# Patient Record
Sex: Female | Born: 1965 | Race: White | Hispanic: No | Marital: Married | Smoking: Never smoker
Health system: Southern US, Community
[De-identification: ages and names within clinical notes are randomized; demographics above are authoritative.]

## PROBLEM LIST (undated history)

## (undated) DIAGNOSIS — E785 Hyperlipidemia, unspecified: Secondary | ICD-10-CM

## (undated) DIAGNOSIS — I10 Essential (primary) hypertension: Secondary | ICD-10-CM

## (undated) HISTORY — DX: Essential (primary) hypertension: I10

## (undated) HISTORY — PX: CHOLECYSTECTOMY: SHX55

## (undated) HISTORY — DX: Hyperlipidemia, unspecified: E78.5

## (undated) HISTORY — PX: SPINAL FUSION: SHX223

## (undated) HISTORY — PX: HYSTERECTOMY ABDOMINAL WITH SALPINGECTOMY: SHX6725

---

## 1997-12-13 ENCOUNTER — Other Ambulatory Visit: Admission: RE | Admit: 1997-12-13 | Discharge: 1997-12-13 | Payer: Self-pay | Admitting: Obstetrics & Gynecology

## 1998-01-17 ENCOUNTER — Other Ambulatory Visit: Admission: RE | Admit: 1998-01-17 | Discharge: 1998-01-17 | Payer: Self-pay | Admitting: Obstetrics & Gynecology

## 1998-04-04 ENCOUNTER — Other Ambulatory Visit: Admission: RE | Admit: 1998-04-04 | Discharge: 1998-04-04 | Payer: Self-pay | Admitting: Obstetrics & Gynecology

## 1998-06-26 ENCOUNTER — Other Ambulatory Visit: Admission: RE | Admit: 1998-06-26 | Discharge: 1998-06-26 | Payer: Self-pay | Admitting: Obstetrics & Gynecology

## 1998-07-28 ENCOUNTER — Other Ambulatory Visit: Admission: RE | Admit: 1998-07-28 | Discharge: 1998-07-28 | Payer: Self-pay | Admitting: Obstetrics & Gynecology

## 1999-03-14 ENCOUNTER — Other Ambulatory Visit: Admission: RE | Admit: 1999-03-14 | Discharge: 1999-03-14 | Payer: Self-pay | Admitting: Obstetrics & Gynecology

## 1999-10-02 ENCOUNTER — Other Ambulatory Visit: Admission: RE | Admit: 1999-10-02 | Discharge: 1999-10-02 | Payer: Self-pay | Admitting: Obstetrics & Gynecology

## 1999-12-14 ENCOUNTER — Ambulatory Visit (HOSPITAL_COMMUNITY): Admission: RE | Admit: 1999-12-14 | Discharge: 1999-12-14 | Payer: Self-pay | Admitting: Obstetrics & Gynecology

## 2000-03-04 ENCOUNTER — Other Ambulatory Visit: Admission: RE | Admit: 2000-03-04 | Discharge: 2000-03-04 | Payer: Self-pay | Admitting: Obstetrics & Gynecology

## 2000-05-30 ENCOUNTER — Encounter (INDEPENDENT_AMBULATORY_CARE_PROVIDER_SITE_OTHER): Payer: Self-pay

## 2000-05-30 ENCOUNTER — Observation Stay (HOSPITAL_COMMUNITY): Admission: RE | Admit: 2000-05-30 | Discharge: 2000-05-31 | Payer: Self-pay | Admitting: Obstetrics & Gynecology

## 2000-09-15 ENCOUNTER — Other Ambulatory Visit: Admission: RE | Admit: 2000-09-15 | Discharge: 2000-09-15 | Payer: Self-pay | Admitting: Obstetrics & Gynecology

## 2001-10-29 ENCOUNTER — Other Ambulatory Visit: Admission: RE | Admit: 2001-10-29 | Discharge: 2001-10-29 | Payer: Self-pay | Admitting: Obstetrics & Gynecology

## 2002-02-17 ENCOUNTER — Other Ambulatory Visit: Admission: RE | Admit: 2002-02-17 | Discharge: 2002-02-17 | Payer: Self-pay | Admitting: Obstetrics and Gynecology

## 2002-02-17 ENCOUNTER — Other Ambulatory Visit: Admission: RE | Admit: 2002-02-17 | Discharge: 2002-02-17 | Payer: Self-pay | Admitting: Family Medicine

## 2003-01-05 ENCOUNTER — Other Ambulatory Visit: Admission: RE | Admit: 2003-01-05 | Discharge: 2003-01-05 | Payer: Self-pay | Admitting: Obstetrics & Gynecology

## 2004-08-31 ENCOUNTER — Other Ambulatory Visit: Admission: RE | Admit: 2004-08-31 | Discharge: 2004-08-31 | Payer: Self-pay | Admitting: Obstetrics & Gynecology

## 2009-01-03 ENCOUNTER — Ambulatory Visit (HOSPITAL_COMMUNITY): Admission: RE | Admit: 2009-01-03 | Discharge: 2009-01-03 | Payer: Self-pay | Admitting: Obstetrics & Gynecology

## 2009-01-03 ENCOUNTER — Encounter (INDEPENDENT_AMBULATORY_CARE_PROVIDER_SITE_OTHER): Payer: Self-pay | Admitting: Obstetrics & Gynecology

## 2010-09-30 LAB — CBC
HCT: 41 % (ref 36.0–46.0)
Hemoglobin: 14.2 g/dL (ref 12.0–15.0)
MCHC: 34.7 g/dL (ref 30.0–36.0)
MCV: 91.3 fL (ref 78.0–100.0)
Platelets: 227 10*3/uL (ref 150–400)
RDW: 13.5 % (ref 11.5–15.5)

## 2010-09-30 LAB — APTT: aPTT: 35 seconds (ref 24–37)

## 2010-11-06 NOTE — Op Note (Signed)
[name], Shirley Barber                 ACCOUNT NO.:  [id number]   MEDICAL RECORD NO.:  [id number]          PATIENT TYPE:  AMB   LOCATION:  SDC                           FACILITY:  [facility]   PHYSICIAN:  Freddy Finner, M.D.   DATE OF BIRTH:  11-26-1965   DATE OF PROCEDURE:  01/03/2009  DATE OF DISCHARGE:                               OPERATIVE REPORT   PREOPERATIVE DIAGNOSES:  1. Pelvic pain.  2. Family history of breast cancer.  3. 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  4. 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   A second procedure is performed by Dr. Henderson Cloud and will be dictated  separately which is a trans-obturator tape, elevation of the  urethrovesical angle.   ANESTHESIA:  General endotracheal anesthesia.   ESTIMATED INTRAOPERATIVE BLOOD LOSS:  During the laparoscopy was 10 cc.   INTRAOPERATIVE COMPLICATIONS:  During the laparoscopy were none.   LAPAROSCOPIC PROCEDURE:  Bilateral salpingo-oophorectomy.   The patient is a 45 year old admitted on the morning of surgery who was  given a gram of Ancef preoperatively.  She was brought to the operating  room and now placed under adequate general endotracheal anesthesia,  placed in the dorsal lithotomy position using the [place] stirrup system.  Betadine prep of abdomen, perineum, mons and vagina was carried out in a  fashion similar to a laparoscopic-assisted vaginal hysterectomy.  The  bladder was evacuated with a Robinson catheter.  Sterile drapes were  applied.  Two small incisions were made, one at the umbilicus and just  above the symphysis and an 11-bladed disposable trocar was introduced  out the umbilicus while elevating the anterior abdominal wall manually.  Direct inspection revealed adequate placement with no evidence of injury  on entry.  Pneumoperitoneum was allowed to accumulate.  A 5-mm trocar  was placed through a small incision just above the  symphysis in the  midline.  This was done under direct visualization, again with no  evidence of injury on entry.  Through it, __________grasping forceps and  later the Nezhat irrigation system was used during the procedure.   Inspection of abdomen and pelvis was carried out.  No identifiable  abnormalities were made. There appeared to be no upper abdominal  adhesions.  The appendix was not visualized.  The tubes and ovaries were  normal.  The uterus was surgically absent.  Peritoneal surfaces of the  pelvis were normal.   The EnSeal device was then used to progressive seal and divide the right  infundibulopelvic ligament, round ligament and the remaining fragment of  upper broad ligament.  The left side was then treated essentially  identically.  The ovaries were morcellated and each was delivered in  segments through the operating channel of the laparoscope.  Irrigation  was carried out using the Nezhat system and lactated Ringer's solution.  The pelvis was completely dry under irrigation and under reduced intra-  abdominal pressure.  Photographs were made before and after removal of  the ovaries.  This portion of  the procedure was terminated.  Gas was  allowed to escape from the abdomen.  Incisions  were anesthetized with 0.25% plain Marcaine and closed with interrupted  subcuticular sutures of 3-0 Dexon.  Steri-Strips were applied to the  lower incision, sterile dressing to the umbilical incision.   At this point, Dr. Henderson Cloud proceeded with his procedure and that note  will be dictated separately.      Freddy Finner, M.D.  Electronically Signed     WRN/MEDQ  D:  01/03/2009  T:  01/03/2009  Job:  [id number]

## 2010-11-06 NOTE — Op Note (Signed)
Shirley Barber, Shirley Barber                 ACCOUNT NO.:  [id number]   MEDICAL RECORD NO.:  [id number]          PATIENT TYPE:  AMB   LOCATION:  SDC                           FACILITY:  [facility]   PHYSICIAN:  Guy Sandifer. Henderson Cloud, M.D. DATE OF BIRTH:  11-Jun-1966   DATE OF PROCEDURE:  01/03/2009  DATE OF DISCHARGE:                               OPERATIVE REPORT   PREOPERATIVE DIAGNOSIS:  Genuine stress urinary incontinence.   POSTOPERATIVE DIAGNOSIS:  Genuine stress urinary incontinence.   PROCEDURE:  Transobturator mid urethral sling and cystoscopy.   SURGEON:  Guy Sandifer. Henderson Cloud, MD   ANESTHESIA:  General with endotracheal intubation.   ESTIMATED BLOOD LOSS:  50 mL.   INDICATIONS AND CONSENT:  This patient is a 45 year old white female  with stress urinary continence on urodynamics and symptomatically.  Transobturator mid urethral sling has been discussed preoperatively.  Potential risks and complications have been discussed preoperatively  including but not limited to infection, organ damage, bleeding requiring  transfusion of blood products with HIV and hepatitis acquisition, DVT,  PE, pneumonia.  Success and failure rate of the slings have been  reviewed.  Delayed healing, erosion, dyspareunia, possible need for  prolonged self-catheterization, return to the operating room,  postoperative irritative voiding symptoms have been reviewed  preoperatively.  All questions were answered and consent is signed on  the chart.   PROCEDURE:  The patient was taken to the operating room where she  underwent laparoscopic BSO with Dr. Jennette Kettle which is dictated under a  separate note.  At the conclusion of that case, all counts were correct.  The patient was under general anesthesia via endotracheal intubation in  the dorsal lithotomy position.  Weighted speculum was placed.  The  points for the transobturator incisions were marked with a pen  bilaterally.  Both areas were injected with 0.5% lidocaine with  1:200,000 epinephrine.  The Foley catheter was placed and left in place.  The infraurethral vaginal mucosa was also injected in a similar fashion.  Stab incisions were made over the transobturator and marks bilaterally,  and a small midline incision was made below the urethra.  The halo  needles were then passed through the obturator foramen bilaterally with  the passage of the needle tip guided by the examining finger.  Careful  inspection reveals no evidence of perforation of the vaginal mucosa.  The patient was given indigo carmine.  Foley catheter was removed.  Cystoscopy was then carried out with a 70-degree cystoscope.  A good  puff of urine was noted from the ureters bilaterally.  There was no  evidence of bladder perforation or foreign body with a 360-degree  inspection.  The cystoscope was then removed.  Foley catheter was  replaced and left in place.  Polypropylene mesh sling was then placed on  the needle tips bilaterally and withdrawn back to the incisions  bilaterally.  Careful attention was made to assure that the sling was  resting flat with no kinks or rolls.  The sheath was then removed  intact.  Attention was adjusted so that the [facility] clamp placed below  the  sling can be rotated 90 degrees to the floor with zero tension.  Excess sling was trimmed at the level of the skin, and both skin  incisions were closed with Dermabond.  Vaginal incision was closed with  a running locking 2-0 Monocryl suture.  All counts were correct.  The  patient was awakened, taken to recovery room in stable condition.      Guy Sandifer Henderson Cloud, M.D.  Electronically Signed     JET/MEDQ  D:  01/03/2009  T:  01/04/2009  Job:  [id number]

## 2010-11-09 NOTE — Op Note (Signed)
[facility]  Patient:    Shirley Barber, Shirley Barber                          MRN: [id number] Proc. Date: 05/30/00 Attending:  Freddy Finner, M.D.                           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  OPERATIVE PROCEDURE:          Total vaginal hysterectomy.  SURGEON:                      Freddy Finner, M.D.  ASSISTANT:                    Luvenia Redden, M.D.  ESTIMATED BLOOD LOSS:         Less than 100 cc.  INTRAOPERATIVE COMPLICATIONS: None.  ANESTHESIA:                   General endotracheal.  PRESENT HISTORY:              Noted in the admission history and physical. She is admitted now for vaginal surgery.  DESCRIPTION OF PROCEDURE:  She was admitted on the morning of surgery, brought to the operating room.  There placed under adequate general endotracheal anesthesia.  She did receive one gram of Cefotan before arriving in the OR and was placed in PAF hose.  The perineum, lower abdomen and vagina were prepped in the usual fashion.  Sterile drapes were applied.  Posterior bladder and vaginal retractor was placed.  The cervix was grasped with a Gerilyn Pilgrim tenaculum.  Posterior colpotomy incision was made with curved Mayo scissors.  The cervix was circumscribed with a scalpel to release the mucosa.  Curved Heaney clamps were used to crossclamp the uterosacral pedicles.  These were divided sharply and ligated with 0 Monocryl in the Heaney fashion.  Bladder pillows were taken separately, divided sharply and ligated with 0 Monocryl.  The bladder was further advanced off of the cervix.  Total ligament pedicles were taken with curved Heaney clamps, divided sharply and then ligated with 0 Monocryl.  The perineum was entered anteriorly, thus the pedicles  were then taken with curved Heaney clamps, divided sharply and ligated with 0 Monocryl.  The uterus was delivered through the vaginal introitus.  Utero-ovarian pedicles were crossclamped with curved Heaney clamps, divided sharply and doubly ligated with free ties of 0 monocryl, followed by suture ligatures of 0 monocryl.  Small bleeding sources proximal to this pedicle on the left were fulgurated with the Bovie.  Hemostasis appeared to be adequate.  Tubes and ovaries were inspected and found to be normal.  These pedicles were then released.  The angles of the vagina were anchored to the uterosacrals with a mattress suture of 0 Monocryl.  The uterosacrals were plicated and posterior perineum closed with a single run of interrupted 0 Vicryl suture.  The vaginal cuff was closed vertically with figure-of-eight 0 Monocryl.  Hemostasis was complete. Foley catheter was placed.  The patient was awakened and taken to the recovery room in good condition.      DESCRIPTION OF PROCEDURE: DD:  05/30/00 TD:  05/30/00 Job: [id number]

## 2010-11-09 NOTE — H&P (Signed)
[facility]  Patient:    Shirley Barber, Shirley Barber                          MRN: [id number] Adm. Date:  05/30/00 Attending:  Freddy Finner, M.D.                         History and Physical  ADMITTING DIAGNOSES:          1. Uterine enlargement consistent with                                  adenomyosis.                               2. Pelvic endometriosis.                               3. Persistent pelvic pain.                               4. Dyspareunia, unresponsive to more                                  conservative surgery and to continuous                                  oral contraceptive treatment.  HISTORY OF PRESENT ILLNESS:   Patient is a 45 year old white married female, gravida 3, para 2, who has long-standing history of benign pelvic endometriosis, with the first diagnosis being made in 1989 at the time of laparoscopic sterilization.  She had conservative surgery laparoscopically for endometriosis in 1994 and again in June of 2001.  Her current endometriosis was noted at that time and surgically removed by fulguration of the lesions. She also had uterosacral nerve ablation and was found to have enlargement of the uterus.  Following surgery she was started on Loestrin 1.5 30 continuously for six weeks and still did not have complete resolution of her symptoms.  She has requested definitive surgical intervention and is admitted at this time for vaginal hysterectomy.  She has reviewed a video in the office describing the operative procedure and I have discussed with her the potential risks of the procedure.  She is prepared to proceed.  REVIEW OF SYSTEMS:            Current review of systems is otherwise negative. No cardiac, pulmonary, GI, or GU complaints.  PAST MEDICAL HISTORY:         Patient has no known significant medical illnesses.  PREVIOUS SURGICAL PROCEDURES: Include cesarean deliveries, laparoscopy on three different  occasions.  DRUG ALLERGIES:               She has no known drug allergies.  MEDICATIONS:                  She is currently on no medications on a chronic basis.  She has never had a blood transfusion.  HABITS:  She does not use alcohol or cigarettes.  FAMILY HISTORY:               Noncontributory.  PHYSICAL EXAMINATION:  HEENT:                        Grossly normal.  VITAL SIGNS:                  Blood pressure in the office is 120/80.  NECK:                         Thyroid gland is not palpably enlarged.  BREASTS:                      Exam is normal.  No palpable masses, no nipple change, no skin change, no nipple discharge.  HEART:                        Normal sinus rhythm.  No murmurs, rubs, or gallops.  CHEST:                        Clear to auscultation.  ABDOMEN:                      Soft and nontender.  No appreciable organomegaly or palpable masses.  PELVIC FINDINGS:              The external genitalia and vagina and cervix are normal.  Bimanual reveals the uterus to be enlarged to approximately eight weeks size and is mildly tender.  There are no palpable adnexal masses.  The rectum and rectovaginal exam confirm these findings.  RECTUM:                       Palpably normal.  LABORATORY:                   Recent mammogram in June of 2001 was normal. Recent Pap smear in September of 2001 was normal.  ASSESSMENT:                   Persistent pelvic pain and dyspareunia unresponsive to oral contraceptives and to conservative surgery.  PLAN:                         Total vaginal hysterectomy. DD:  05/29/00 TD:  05/29/00 Job: [id number]

## 2010-11-09 NOTE — Discharge Summary (Signed)
[facility]  Patient:    Shirley Barber, Shirley Barber                        MRN: [id number] Adm. Date:  [id number] Disc. Date: [id number] Attending:  Minette Headland                           Discharge Summary  DISCHARGE DIAGNOSES:          1. Persistent pelvic pain.                               2. Dyspareunia, unresponsive to conservative                                  measures.                               3. Pelvic endometriosis.                               4. Clinical symptoms suggestive of                                  adenomyosis, not confirmed histologically.  OPERATION:                    Total vaginal hysterectomy.  SURGEON:                      Freddy Finner, M.D.  POSTOPERATIVE COMPLICATIONS:  None.  DISPOSITION:                  The patient was in satisfactory and improved condition at the time of her discharge.  INSTRUCTIONS:                 She is to progressively increase her physical activity, but no vaginal entry or heavy lifting.  DISCHARGE MEDICATIONS:        She is to take Darvocet p.r.n. pain.  She is to take vitamin and iron supplements.  DIET:                         She is to take a regular diet.  FOLLOWUP:                     She is to return to the office in approximately 10 days for her first postoperative visit.  HISTORY OF PRESENT ILLNESS/PAST HISTORY/FAMILY HISTORY/REVIEW OF SYSTEMS/PHYSICAL EXAMINATION:  Are recorded in the admission noted.  HOSPITAL COURSE:              The patient was admitted on the morning of surgery.  The patient was treated perioperatively with compression hose and intravenous antibiotic.  She was taken to the operating room where the above-described procedure was accomplished, without difficulty.  There were no significant intraoperative complications.  Postoperatively she remained afebrile.  By the evening of the first postoperative day she was considered to be in satisfactory  condition for discharge.  LABORATORY DATA:  Her admission hemoglobin was 13.8, postoperative hemoglobin was 9.6. DD:  07/26/00 TD:  07/26/00 Job: [id number]

## 2010-11-09 NOTE — Op Note (Signed)
[facility]  Patient:    Shirley Barber, Shirley Barber                        MRN: [id number] Proc. Date: 12/14/99 Adm. Date:  [id number] Disc. Date: [id number] Attending:  Minette Headland                           Operative Report  PREOPERATIVE DIAGNOSES:       1. Known uterine enlargement.                               2. Known pelvic endometriosis with previous                                  laparoscopy.                               3. Recurrent right pelvic pain.                               4. Recurrent severe dysmenorrhea and                                  dyspareunia.  POSTOPERATIVE DIAGNOSES:      1. Uterine enlargement.                               2. Recurrent pelvic endometriosis.                               3. Suspected adenomyosis as source of                                  enlargement of uterus.  OPERATION:                    Laparoscopy; fulguration of recurrent endometriotic implant of left pelvis near left uterosacral ligament; uterosacral nerve ablation, bipolar fulguration of uterosacrals.  SURGEON:                      Freddy Finner, M.D.  INDICATIONS:                  Patient is a 46 year old whose last laparoscopy was in 1994.  In the recent past she has had recurrence of dyspareunia, dysmenorrhea, and the current right pelvic pain which is intermittent but chronic.  She has requested repeat surgical intervention and is admitted for laparoscopy.  DESCRIPTION OF PROCEDURE:     She was admitted on the morning of surgery and brought to the operating room, placed under adequate general endotracheal anesthesia, placed in the dorsal lithotomy position using the [facility] stirrup system.  Betadine prep of abdomen, perineum, and vagina was carried out and then was evacuated using sterile technique.  Hulka tenaculum was attached to the cervix without difficulty.  Sterile drapes were applied.  Two small incisions were made, one at the  umbilicus, and  one just above the symphysis. A 10 mm trocar was introduced through the upper incision while elevating the anterior abdominal wall manually.  Direct inspection revealed adequate placement with no evidence of injury on entry.  Pneumoperitoneum was allowed to accumulate.  Scanning systematic examination of the abdominal and pelvic contents was carried out.  The appendix was normal.  Findings were basically as noted above.  There was evidence of old tattooing from previous laparoscopy.  There was a suggestion perhaps of a lesion in the right ovary, which was fulgurated, although basically both ovaries were normal.  There was what appeared to be a fairly definite _________ lesion just medial to the uterosacral ligament on the left.  Using the bipolar forceps the lesion was vaporized as were a couple places on the right ovary.  The uterosacrals were fulgurated at their junction with the cervix.  The uterus was acutely retroverted which precluded sharp dissection of the ligaments.  There were appropriate missing segments of fallopian tube consistent with previous surgical sterilization.  Her appendix was normal. After completing the procedure, gas was allowed to escape.  Instruments were removed.  Skin incisions were closed with interrupted subcuticular sutures of 3-0 Dexon.  Steri-Strips were applied to the lower incision.  One half percent plain Marcaine was injected into the incision sites for postoperative analgesia.  The patient was awakened and taken to the recovery room in good condition and will be discharged in the immediate postoperative period for follow-up in the office in approximately two weeks.  She is to call for fevers, severe pain, or heavy bleeding.  She has Darvocet to be taken as needed for postoperative pain. DD:  12/31/99 TD:  12/31/99 Job: [id number]

## 2014-11-17 ENCOUNTER — Other Ambulatory Visit: Payer: Self-pay | Admitting: Obstetrics and Gynecology

## 2014-11-18 LAB — CYTOLOGY - PAP

## 2017-12-23 DIAGNOSIS — I10 Essential (primary) hypertension: Secondary | ICD-10-CM | POA: Diagnosis not present

## 2017-12-23 DIAGNOSIS — E559 Vitamin D deficiency, unspecified: Secondary | ICD-10-CM | POA: Diagnosis not present

## 2017-12-23 DIAGNOSIS — E785 Hyperlipidemia, unspecified: Secondary | ICD-10-CM | POA: Diagnosis not present

## 2017-12-24 DIAGNOSIS — I10 Essential (primary) hypertension: Secondary | ICD-10-CM | POA: Diagnosis not present

## 2017-12-24 DIAGNOSIS — E559 Vitamin D deficiency, unspecified: Secondary | ICD-10-CM | POA: Diagnosis not present

## 2018-01-22 DIAGNOSIS — Z6831 Body mass index (BMI) 31.0-31.9, adult: Secondary | ICD-10-CM | POA: Diagnosis not present

## 2018-01-22 DIAGNOSIS — E669 Obesity, unspecified: Secondary | ICD-10-CM | POA: Diagnosis not present

## 2018-04-09 DIAGNOSIS — D225 Melanocytic nevi of trunk: Secondary | ICD-10-CM | POA: Diagnosis not present

## 2018-04-09 DIAGNOSIS — L918 Other hypertrophic disorders of the skin: Secondary | ICD-10-CM | POA: Diagnosis not present

## 2018-04-09 DIAGNOSIS — L820 Inflamed seborrheic keratosis: Secondary | ICD-10-CM | POA: Diagnosis not present

## 2019-04-20 ENCOUNTER — Ambulatory Visit: Payer: Self-pay | Admitting: Cardiology

## 2019-04-23 ENCOUNTER — Ambulatory Visit: Payer: Self-pay | Admitting: Cardiology

## 2019-06-08 ENCOUNTER — Encounter: Payer: Self-pay | Admitting: Cardiology

## 2019-06-24 ENCOUNTER — Other Ambulatory Visit: Payer: Self-pay

## 2019-06-24 ENCOUNTER — Ambulatory Visit: Payer: No Typology Code available for payment source

## 2019-06-24 ENCOUNTER — Ambulatory Visit: Payer: No Typology Code available for payment source | Admitting: Cardiology

## 2019-06-24 ENCOUNTER — Encounter: Payer: Self-pay | Admitting: Cardiology

## 2019-06-24 VITALS — BP 140/98 | HR 65 | Ht 65.0 in | Wt 193.2 lb

## 2019-06-24 DIAGNOSIS — R0602 Shortness of breath: Secondary | ICD-10-CM | POA: Insufficient documentation

## 2019-06-24 DIAGNOSIS — R002 Palpitations: Secondary | ICD-10-CM

## 2019-06-24 DIAGNOSIS — R079 Chest pain, unspecified: Secondary | ICD-10-CM | POA: Diagnosis not present

## 2019-06-24 DIAGNOSIS — I10 Essential (primary) hypertension: Secondary | ICD-10-CM

## 2019-06-24 HISTORY — DX: Shortness of breath: R06.02

## 2019-06-24 HISTORY — DX: Chest pain, unspecified: R07.9

## 2019-06-24 HISTORY — DX: Essential (primary) hypertension: I10

## 2019-06-24 HISTORY — DX: Palpitations: R00.2

## 2019-06-24 MED ORDER — METOPROLOL TARTRATE 50 MG PO TABS: ORAL_TABLET | ORAL | 0 refills | Status: DC

## 2019-06-24 MED ORDER — NITROGLYCERIN 0.4 MG SL SUBL: 0.4000 mg | SUBLINGUAL_TABLET | SUBLINGUAL | 3 refills | Status: DC | PRN

## 2019-06-24 MED ORDER — AMLODIPINE BESYLATE 5 MG PO TABS: 5.0000 mg | ORAL_TABLET | Freq: Every day | ORAL | 3 refills | Status: DC

## 2019-06-24 NOTE — Progress Notes (Signed)
Cardiology Office Note:    Date:  06/24/2019   ID:  Shirley, Barber [birth year], MRN [id number]  PCP:  Pc, [facility]  Cardiologist:  Berniece Salines, DO  Electrophysiologist:  None   Referring MD: No ref. provider found   The patient was referred for hypertension hyperlipidemia History of Present Illness:    Shirley Barber is a 53 y.o. female with a hx of hypertension, hyperlipidemia and premature coronary disease in her degree relatives.  Presents today to be evaluated for the first time for chest pain and palpitations..  Patient referred by her primary care provider - Shirley Barber.  The patient tells me that for a while now she has been experiencing intermittent chest pain.  She described this as a midsternal chest heaviness that does not radiate but usually occurs with meals.  She notes that when this comes on feels as if something is sitting on her chest at which time she explained significant shortness of breath and palpitations.  She tells me that the palpitations are really abrupt, feels as if her heart is pounding out of her chest and stops abruptly.  She is concerned because of her brothers and sisters have all had heart attacks in their 42s and 23s.  She also tells me she has been having frequent coughing on lisinopril and wishes to try another agent.  Past Medical History:  Diagnosis Date  . HTN (hypertension)   . Hyperlipidemia     Past Surgical History:  Procedure Laterality Date  . CESAREAN SECTION    . CHOLECYSTECTOMY    . HYSTERECTOMY ABDOMINAL WITH SALPINGECTOMY    . SPINAL FUSION      Current Medications: Current Meds  Medication Sig  . ZINC-VITAMIN C MT Use as directed in the mouth or throat.  . [DISCONTINUED] lisinopril (ZESTRIL) 10 MG tablet Take 5 mg by mouth daily.     Allergies:   Morphine, Nitrofurantoin, and Other   Social History   Socioeconomic History  . Marital status: Married    Spouse name: Not on file  . Number of  children: Not on file  . Years of education: Not on file  . Highest education level: Not on file  Occupational History  . Not on file  Tobacco Use  . Smoking status: Never Smoker  . Smokeless tobacco: Never Used  Substance and Sexual Activity  . Alcohol use: Not Currently    Comment: occasionally  . Drug use: Never  . Sexual activity: Not on file  Other Topics Concern  . Not on file  Social History Narrative  . Not on file   Social Determinants of Health   Financial Resource Strain:   . Difficulty of Paying Living Expenses: Not on file  Food Insecurity:   . Worried About Charity fundraiser in the Last Year: Not on file  . Ran Out of Food in the Last Year: Not on file  Transportation Needs:   . Lack of Transportation (Medical): Not on file  . Lack of Transportation (Non-Medical): Not on file  Physical Activity:   . Days of Exercise per Week: Not on file  . Minutes of Exercise per Session: Not on file  Stress:   . Feeling of Stress : Not on file  Social Connections:   . Frequency of Communication with Friends and Family: Not on file  . Frequency of Social Gatherings with Friends and Family: Not on file  . Attends Religious Services: Not on file  .  Active Member of Clubs or Organizations: Not on file  . Attends Archivist Meetings: Not on file  . Marital Status: Not on file     Family History: The patient's family history includes Breast cancer in her mother; CAD in her father; Congestive Heart Failure in her father; Diabetes in her brother and father; Healthy in her brother; Heart attack in her brother and sister; Hypertension in her father; Renal Disease in her father.  ROS:   Review of Systems  Constitution: Negative for decreased appetite, fever and weight gain.  HENT: Negative for congestion, ear discharge, hoarse voice and sore throat.   Eyes: Negative for discharge, redness, vision loss in right eye and visual halos.  Cardiovascular: Negative for chest  pain, dyspnea on exertion, leg swelling, orthopnea and palpitations.  Respiratory: Negative for cough, hemoptysis, shortness of breath and snoring.   Endocrine: Negative for heat intolerance and polyphagia.  Hematologic/Lymphatic: Negative for bleeding problem. Does not bruise/bleed easily.  Skin: Negative for flushing, nail changes, rash and suspicious lesions.  Musculoskeletal: Negative for arthritis, joint pain, muscle cramps, myalgias, neck pain and stiffness.  Gastrointestinal: Negative for abdominal pain, bowel incontinence, diarrhea and excessive appetite.  Genitourinary: Negative for decreased libido, genital sores and incomplete emptying.  Neurological: Negative for brief paralysis, focal weakness, headaches and loss of balance.  Psychiatric/Behavioral: Negative for altered mental status, depression and suicidal ideas.  Allergic/Immunologic: Negative for HIV exposure and persistent infections.    EKGs/Labs/Other Studies Reviewed:    The following studies were reviewed today:   EKG:  The ekg ordered today demonstrates sinus rhythm, heart rate 65 bpm with short PR interval.  Recent Labs: CBC: WBC 6.4, hemoglobin 13.7, hematocrit 41.2, platelet 280 Chemistry: Glucose 98, BUN 19, creatinine 0.96, sodium 141, potassium 4.3, chloride 105, bicarb 23, calcium 9.2, total protein 7.2, total bili 2.8, total bilirubin 0.4, alk phos 114, AST 50, ALT 75 Hemoglobin A1c 8.3 TSH 1.410 Vitamin D 26.8  Recent Lipid Panel Total cholesterol 190, triglyceride 133, HDL 65, LDL 102  Physical Exam:    VS:  BP (!) 140/98 (BP Location: Right Arm)   Pulse 65   Ht _0  (1.651 m)   Wt 193 lb 3.2 oz (87.6 kg)   BMI 32.15 kg/m     Wt Readings from Last 3 Encounters:  06/24/19 193 lb 3.2 oz (87.6 kg)    GEN: Well nourished, well developed in no acute distress HEENT: Normal NECK: No JVD; No carotid bruits LYMPHATICS: No lymphadenopathy CARDIAC: S1S2 noted,RRR, no murmurs, rubs,  gallops RESPIRATORY:  Clear to auscultation without rales, wheezing or rhonchi  ABDOMEN: Soft, non-tender, non-distended, +bowel sounds, no guarding. EXTREMITIES: No edema, No cyanosis, no clubbing MUSCULOSKELETAL:  No edema; No deformity  SKIN: Warm and dry NEUROLOGIC:  Alert and oriented x 3, non-focal PSYCHIATRIC:  Normal affect, good insight  ASSESSMENT:    1. Hypertension, unspecified type   2. Chest pain, unspecified type   3. Palpitations   4. Shortness of breath    PLAN:    1.  Her chest pain is concerning though atypical but this patient has significant family history as well as risk factor including hypertension and hyperlipidemia.  Therefore at this time is appropriate to pursue ischemic evaluation.  I discussed with the patient about a coronary CTA at this time she is willing to proceed with this testing.  She was educated and denies any IV dye contrast allergy.  Sublingual nitroglycerin prescription was sent, its protocol and 911 protocol  explained and the patient vocalized understanding questions were answered to the patient's satisfaction  2.  Hyperlipidemia-she has been previously started on Repatha which seems to be helping as her most recent LDL on December 15 with her PCP was 102.  She previously told me that this was in the high 300s.  3.  Hypertension-her blood pressure is elevated in the office today she has missed her lisinopril for 2 days.  She is having significant coughing with the new pill and prefers to be placed on another agent.  At this time amlodipine 5 mg will be started and will titrate as appropriate.  The patient was asked to take her blood pressure daily and bring these reports for me to be able to review in adjust medications as appropriate.  4.  I would like to rule out a cardiovascular etiology of this palpitation, therefore at this time I would like to placed a zio patch for 7 days. In additon a transthoracic echocardiogram will be ordered to assess  LV/RV function and any structural abnormalities. Once these testing have been performed amd reviewed further reccomendations will be made. For now, I do reccomend that the patient goes to the nearest ED if  symptoms recur.  The patient is in agreement with the above plan. The patient left the office in stable condition.  The patient will follow up in 3 months or sooner if needed   Medication Adjustments/Labs and Tests Ordered: Current medicines are reviewed at length with the patient today.  Concerns regarding medicines are outlined above.  Orders Placed This Encounter  Procedures  . CT CORONARY FRACTIONAL FLOW RESERVE DATA PREP  . CT CORONARY FRACTIONAL FLOW RESERVE FLUID ANALYSIS  . CT CORONARY MORPH W/CTA COR W/SCORE W/CA W/CM &/OR WO/CM  . LONG TERM MONITOR (3-14 DAYS)  . EKG 12-Lead  . ECHOCARDIOGRAM COMPLETE   Meds ordered this encounter  Medications  . amLODipine (NORVASC) 5 MG tablet    Sig: Take 1 tablet (5 mg total) by mouth daily.    Dispense:  180 tablet    Refill:  3  . nitroGLYCERIN (NITROSTAT) 0.4 MG SL tablet    Sig: Place 1 tablet (0.4 mg total) under the tongue every 5 (five) minutes as needed.    Dispense:  30 tablet    Refill:  3  . metoprolol tartrate (LOPRESSOR) 50 MG tablet    Sig: Take 2 tabs(128m) 2 hours prior to CT appointment    Dispense:  2 tablet    Refill:  0    Patient Instructions  Medication Instructions:  Your physician has recommended you make the following change in your medication:   START: Amlodipine 5 mg Take 1 tab daily  The proper use and anticipated side effects of nitroglycerine has been carefully explained.  If a single episode of chest pain is not relieved by one tablet, the patient will try another within 5 minutes; and if this doesn't relieve the pain, the patient is instructed to call 911 for transportation to an emergency department.  *If you need a refill on your cardiac medications before your next appointment, please call  your pharmacy*  Lab Work: Your physician recommends that you return for lab work in:   3-7 days prior to CT: BMP  If you have labs (blood work) drawn today and your tests are completely normal, you will receive your results only by: .Marland KitchenMyChart Message (if you have MyChart) OR . A paper copy in the mail If you have any  lab test that is abnormal or we need to change your treatment, we will call you to review the results.  Testing/Procedures: Your physician has requested that you have an echocardiogram. Echocardiography is a painless test that uses sound waves to create images of your heart. It provides your doctor with information about the size and shape of your heart and how well your heart's chambers and valves are working. This procedure takes approximately one hour. There are no restrictions for this procedure.  A zio monitor was ordered today. It will remain on for 7 days. You will then return monitor and event diary in provided box. It takes 1-2 weeks for report to be downloaded and returned to [place]. We will call you with the results. If monitor falls off or has orange flashing light, please call Zio for further instructions.   Your physician has requested that you have cardiac CT. Cardiac computed tomography (CT) is a painless test that uses an x-ray machine to take clear, detailed pictures of your heart. For further information please visit HugeFiesta.tn. Please follow instruction sheet as given.  Your cardiac CT will be scheduled at one of the below locations:   [facility] [address] [place], [place] [address] [phone]   If scheduled at [facility], please arrive at the [facility] main entrance of [facility] 30-45 minutes prior to test start time. Proceed to the [facility] Radiology Department (first floor) to check-in and test prep.  Please follow these instructions carefully (unless otherwise directed):  On the Night Before the  Test: . Be sure to Drink plenty of water. . Do not consume any caffeinated/decaffeinated beverages or chocolate 12 hours prior to your test. . Do not take any antihistamines 12 hours prior to your test.  On the Day of the Test: . Drink plenty of water. Do not drink any water within one hour of the test. . Do not eat any food 4 hours prior to the test. . You may take your regular medications prior to the test.  . Take metoprolol (Lopressor) two hours prior to test.                   -If HR is less than 55 BPM- No Beta Blocker(metoprolol)                -IF HR is greater than 55 BPM and patient is less than or equal to 64 yrs old Lopressor(metoprolol) 119m x1.             After the Test: . Drink plenty of water. . After receiving IV contrast, you may experience a mild flushed feeling. This is normal. . On occasion, you may experience a mild rash up to 24 hours after the test. This is not dangerous. If this occurs, you can take Benadryl 25 mg and increase your fluid intake. . If you experience trouble breathing, this can be serious. If it is severe call 911 IMMEDIATELY. If it is mild, please call our office.   Once we have confirmed authorization from your insurance company, we will call you to set up a date and time for your test.   For non-scheduling related questions, please contact the cardiac imaging nurse navigator should you have any questions/concerns: SMarchia Bond RN Navigator Cardiac Imaging [facility] [phone]    Follow-Up: [facility] you and your health needs are our priority.  As part of our continuing mission  to provide you with exceptional heart care, we have created designated Provider Care Teams.  These Care Teams include your primary Cardiologist (physician) and Advanced Practice Providers (APPs -  Physician Assistants and Nurse Practitioners) who all work together to provide you with the care you need, when you need  it.  Your next appointment:   3 month(s)  The format for your next appointment:   In Person  Provider:   Berniece Salines, DO  Other Instructions      Adopting a Healthy Lifestyle.  Know what a healthy weight is for you (roughly BMI <25) and aim to maintain this   Aim for 7+ servings of fruits and vegetables daily   65-80+ fluid ounces of water or unsweet tea for healthy kidneys   Limit to max 1 drink of alcohol per day; avoid smoking/tobacco   Limit animal fats in diet for cholesterol and heart health - choose grass fed whenever available   Avoid highly processed foods, and foods high in saturated/trans fats   Aim for low stress - take time to unwind and care for your mental health   Aim for 150 min of moderate intensity exercise weekly for heart health, and weights twice weekly for bone health   Aim for 7-9 hours of sleep daily   When it comes to diets, agreement about the perfect plan isnt easy to find, even among the experts. Experts at the [employer] developed an idea known as the Healthy Eating Plate. Just imagine a plate divided into logical, healthy portions.   The emphasis is on diet quality:   Load up on vegetables and fruits - one-half of your plate: Aim for color and variety, and remember that potatoes dont count.   Go for whole grains - one-quarter of your plate: Whole wheat, barley, wheat berries, quinoa, oats, brown rice, and foods made with them. If you want pasta, go with whole wheat pasta.   Protein power - one-quarter of your plate: Fish, chicken, beans, and nuts are all healthy, versatile protein sources. Limit red meat.   The diet, however, does go beyond the plate, offering a few other suggestions.   Use healthy plant oils, such as olive, canola, soy, corn, sunflower and peanut. Check the labels, and avoid partially hydrogenated oil, which have unhealthy trans fats.   If youre thirsty, drink water. Coffee and tea are good in  moderation, but skip sugary drinks and limit milk and dairy products to one or two daily servings.   The type of carbohydrate in the diet is more important than the amount. Some sources of carbohydrates, such as vegetables, fruits, whole grains, and beans-are healthier than others.   Finally, stay active  Signed, Berniece Salines, DO  06/24/2019 10:10 AM    [place]

## 2019-06-24 NOTE — Patient Instructions (Signed)
Medication Instructions:  Your physician has recommended you make the following change in your medication:   START: Amlodipine 5 mg Take 1 tab daily  The proper use and anticipated side effects of nitroglycerine has been carefully explained.  If a single episode of chest pain is not relieved by one tablet, the patient will try another within 5 minutes; and if this doesn't relieve the pain, the patient is instructed to call 911 for transportation to an emergency department.  *If you need a refill on your cardiac medications before your next appointment, please call your pharmacy*  Lab Work: Your physician recommends that you return for lab work in:   3-7 days prior to CT: BMP  If you have labs (blood work) drawn today and your tests are completely normal, you will receive your results only by: Marland Kitchen MyChart Message (if you have MyChart) OR . A paper copy in the mail If you have any lab test that is abnormal or we need to change your treatment, we will call you to review the results.  Testing/Procedures: Your physician has requested that you have an echocardiogram. Echocardiography is a painless test that uses sound waves to create images of your heart. It provides your doctor with information about the size and shape of your heart and how well your heart's chambers and valves are working. This procedure takes approximately one hour. There are no restrictions for this procedure.  A zio monitor was ordered today. It will remain on for 7 days. You will then return monitor and event diary in provided box. It takes 1-2 weeks for report to be downloaded and returned to [place]. We will call you with the results. If monitor falls off or has orange flashing light, please call Zio for further instructions.   Your physician has requested that you have cardiac CT. Cardiac computed tomography (CT) is a painless test that uses an x-ray machine to take clear, detailed pictures of your heart. For further information  please visit HugeFiesta.[email or web address]. Please follow instruction sheet as given.  Your cardiac CT will be scheduled at one of the below locations:   [facility] [address] [place], [place] [address] [phone]   If scheduled at [facility], please arrive at the [facility] main entrance of [facility] 30-45 minutes prior to test start time. Proceed to the [facility] Radiology Department (first floor) to check-in and test prep.  Please follow these instructions carefully (unless otherwise directed):  On the Night Before the Test: . Be sure to Drink plenty of water. . Do not consume any caffeinated/decaffeinated beverages or chocolate 12 hours prior to your test. . Do not take any antihistamines 12 hours prior to your test.  On the Day of the Test: . Drink plenty of water. Do not drink any water within one hour of the test. . Do not eat any food 4 hours prior to the test. . You may take your regular medications prior to the test.  . Take metoprolol (Lopressor) two hours prior to test.                   -If HR is less than 55 BPM- No Beta Blocker(metoprolol)                -IF HR is greater than 55 BPM and patient is less than or equal to 38 yrs old Lopressor(metoprolol) 100mg  x1.  After the Test: . Drink plenty of water. . After receiving IV contrast, you may experience a mild flushed feeling. This is normal. . On occasion, you may experience a mild rash up to 24 hours after the test. This is not dangerous. If this occurs, you can take Benadryl 25 mg and increase your fluid intake. . If you experience trouble breathing, this can be serious. If it is severe call 911 IMMEDIATELY. If it is mild, please call our office.   Once we have confirmed authorization from your insurance company, we will call you to set up a date and time for your test.   For non-scheduling related questions, please contact the cardiac imaging nurse navigator should  you have any questions/concerns: Rockwell Alexandria, RN Navigator Cardiac Imaging [name] [phone] Office    Follow-Up: At [facility], you and your health needs are our priority.  As part of our continuing mission to provide you with exceptional heart care, we have created designated Provider Care Teams.  These Care Teams include your primary Cardiologist (physician) and Advanced Practice Providers (APPs -  Physician Assistants and Nurse Practitioners) who all work together to provide you with the care you need, when you need it.  Your next appointment:   3 month(s)  The format for your next appointment:   In Person  Provider:   Thomasene Ripple, DO  Other Instructions

## 2019-07-23 ENCOUNTER — Telehealth: Payer: Self-pay | Admitting: Internal Medicine

## 2019-07-23 NOTE — Telephone Encounter (Signed)
Ms. Stoney called to discuss her blood pressure. She was recently seen by Dr. Servando Salina and switched from lisinopril to amlodipine 5mg . Her blood pressures were well controlled on this regimen. She was recently diagnosed with COVID and has noticed that her blood pressures are higher than normal. She took her blood pressures several times today and they have been around 150s-160s/100s. She denies any headaches, vision changes or chest pain. She did take a second dose of amlodipine 5mg  this afternoon with her blood pressure being high. I advised her to begin taking 10mg  total of amlodipine each day beginning tomorrow morning. She will continue to check her pressures and let know if this helps.   , MD

## 2019-07-26 NOTE — Telephone Encounter (Signed)
Telephone call to patient. Left message that we received a note from Dr Meredeth Ide regarding her blood pressure and was checking on how she was doing?Also instructed pt to keep a log of her blood pressures to bring to her next visit.

## 2019-08-05 ENCOUNTER — Telehealth: Payer: Self-pay | Admitting: Cardiology

## 2019-08-05 NOTE — Telephone Encounter (Signed)
She is supposed to be on amlodipine 5 mg daily.

## 2019-08-05 NOTE — Telephone Encounter (Signed)
Pt c/o medication issue:  1. Name of Medication: amLODipine (NORVASC) 5 MG tablet  2. How are you currently taking this medication (dosage and times per day)? Unsure  3. Are you having a reaction (difficulty breathing--STAT)? no  4. What is your medication issue? [place] from [facility] states the patient told her that Dr. Servando Salina increased this medication to two times daily for a total of 10mg . Please advise.

## 2019-08-05 NOTE — Telephone Encounter (Signed)
She is supposed to be on amlodipine 5 mg daily which I started at her last office visit. She was also on lisinopril at the time of her visit. She is scheduled for August 20, 2019 if she wants to be seen sooner than her February 26 date that will be fine so we can discuss her appropriate medication regimen.

## 2019-08-06 ENCOUNTER — Other Ambulatory Visit: Payer: Self-pay | Admitting: *Deleted

## 2019-08-06 ENCOUNTER — Telehealth (HOSPITAL_COMMUNITY): Payer: Self-pay | Admitting: Emergency Medicine

## 2019-08-06 ENCOUNTER — Telehealth: Payer: Self-pay | Admitting: Cardiology

## 2019-08-06 MED ORDER — AMLODIPINE BESYLATE 10 MG PO TABS: 10.0000 mg | ORAL_TABLET | Freq: Every day | ORAL | 1 refills | Status: DC

## 2019-08-06 NOTE — Telephone Encounter (Signed)
Pt calling stating that Dr. Servando Salina increased her medication Amlodipine to 10 mg daily. The sig was not changed on the medication and so pt's insurance is giving her a hard time with this. Please resent Rx correctly. Please address

## 2019-08-06 NOTE — Telephone Encounter (Signed)
Amlodipine 10 mg # 30 refill 1. Sent to [facility]

## 2019-08-06 NOTE — Telephone Encounter (Signed)
Reaching out to patient to offer assistance regarding upcoming cardiac imaging study; pt verbalizes understanding of appt date/time, parking situation and where to check in, pre-test NPO status and medications ordered, and verified current allergies; name and call back number provided for further questions should they arise Shirley Alexandria RN Navigator Cardiac Imaging [facility] [phone] office [phone] cell  Pt concerned about taking 100mg  meto prior to scan. Resting HR at last OV 65bpm. Suggested she take 1/2 dose (50mg )

## 2019-08-06 NOTE — Telephone Encounter (Signed)
Spoke with patient who states she was seen at Urgent Care and the physician increased her to 10mg  of amlodipine daily. She also notes that she spoke with an on-call physician after that visit who also instructed her to keep taking this done. Please refer to 07/23/2019 telephone encounter. OK to refill this without appointment per patient request? Thank you!

## 2019-08-09 ENCOUNTER — Telehealth: Payer: Self-pay | Admitting: Cardiology

## 2019-08-09 ENCOUNTER — Ambulatory Visit (HOSPITAL_COMMUNITY)
Admission: RE | Admit: 2019-08-09 | Discharge: 2019-08-09 | Disposition: A | Discharge status: Home / Self Care | Payer: No Typology Code available for payment source | Source: Ambulatory Visit | Attending: Cardiology | Admitting: Cardiology

## 2019-08-09 ENCOUNTER — Other Ambulatory Visit: Payer: Self-pay

## 2019-08-09 ENCOUNTER — Encounter: Payer: No Typology Code available for payment source | Admitting: *Deleted

## 2019-08-09 DIAGNOSIS — I2510 Atherosclerotic heart disease of native coronary artery without angina pectoris: Secondary | ICD-10-CM | POA: Diagnosis not present

## 2019-08-09 DIAGNOSIS — R079 Chest pain, unspecified: Secondary | ICD-10-CM | POA: Diagnosis not present

## 2019-08-09 DIAGNOSIS — Z006 Encounter for examination for normal comparison and control in clinical research program: Secondary | ICD-10-CM

## 2019-08-09 MED ORDER — NITROGLYCERIN 0.4 MG SL SUBL
0.8000 mg | SUBLINGUAL_TABLET | Freq: Once | SUBLINGUAL | Status: AC
  Administered 2019-08-09: 0.8 mg via SUBLINGUAL

## 2019-08-09 MED ORDER — IOHEXOL 350 MG/ML SOLN
100.0000 mL | Freq: Once | INTRAVENOUS | Status: AC | PRN
  Administered 2019-08-09: 09:00:00 100 mL via INTRAVENOUS

## 2019-08-09 MED ORDER — NITROGLYCERIN 0.4 MG SL SUBL
SUBLINGUAL_TABLET | SUBLINGUAL | Status: AC
  Filled 2019-08-09: qty 2

## 2019-08-09 NOTE — Telephone Encounter (Signed)
Follow Up:     Pt is returning your call. 

## 2019-08-09 NOTE — Research (Signed)
CADFEM Informed Consent                  Subject Name:   Ilma Achee   Subject met inclusion and exclusion criteria.  The informed consent form, study requirements and expectations were reviewed with the subject and questions and concerns were addressed prior to the signing of the consent form.  The subject verbalized understanding of the trial requirements.  The subject agreed to participate in the CADFEM trial and signed the informed consent.  The informed consent was obtained prior to performance of any protocol-specific procedures for the subject.  A copy of the signed informed consent was given to the subject and a copy was placed in the subject's medical record.   [place] Micaela Stith, Research Assistant 002/15/2021   07:41 a.m.

## 2019-08-09 NOTE — Progress Notes (Signed)
Pt tolerated procedure well. No complaints. States she feels some fluttering in her chest. Hr 51, bp stable 107/77

## 2019-08-09 NOTE — Telephone Encounter (Signed)
Returned patient call Appointment made for Thursday 08/12/19 8:20

## 2019-08-12 ENCOUNTER — Other Ambulatory Visit: Payer: Self-pay

## 2019-08-12 ENCOUNTER — Telehealth (INDEPENDENT_AMBULATORY_CARE_PROVIDER_SITE_OTHER): Payer: No Typology Code available for payment source | Admitting: Cardiology

## 2019-08-12 VITALS — BP 132/82 | HR 54 | Resp 16 | Ht 65.0 in | Wt 189.0 lb

## 2019-08-12 DIAGNOSIS — I25118 Atherosclerotic heart disease of native coronary artery with other forms of angina pectoris: Secondary | ICD-10-CM | POA: Insufficient documentation

## 2019-08-12 DIAGNOSIS — Z01812 Encounter for preprocedural laboratory examination: Secondary | ICD-10-CM

## 2019-08-12 DIAGNOSIS — R931 Abnormal findings on diagnostic imaging of heart and coronary circulation: Secondary | ICD-10-CM

## 2019-08-12 DIAGNOSIS — I471 Supraventricular tachycardia: Secondary | ICD-10-CM

## 2019-08-12 DIAGNOSIS — R079 Chest pain, unspecified: Secondary | ICD-10-CM

## 2019-08-12 DIAGNOSIS — I4719 Other supraventricular tachycardia: Secondary | ICD-10-CM | POA: Insufficient documentation

## 2019-08-12 HISTORY — DX: Encounter for preprocedural laboratory examination: Z01.812

## 2019-08-12 HISTORY — DX: Abnormal findings on diagnostic imaging of heart and coronary circulation: R93.1

## 2019-08-12 HISTORY — DX: Supraventricular tachycardia: I47.1

## 2019-08-12 HISTORY — DX: Atherosclerotic heart disease of native coronary artery with other forms of angina pectoris: I25.118

## 2019-08-12 NOTE — H&P (View-Only) (Signed)
Telehealth visit  Date:  08/12/2019   ID:  [name], [name] [birth year], MRN [id number]  The patient is at home. The provider is at home.  PCP:  Pc, [facility]  Cardiologist:  Thomasene Ripple, DO  Electrophysiologist:  None   Evaluation Performed:  Follow up visit   Chief Complaint:  Chest pain, follow up to discuss testing result.  History of Present Illness:    Shirley Barber is a 54 y.o. female with a hx of hypertension, hyperlipidemia and premature coronary disease in her degree relatives. The patient presented initially on June 24, 2019 to be evaluated for chest pain and palpitations.  Patient has family history and her symptoms I recommended patient undergo a coronary CTA.  In addition her blood pressure was also elevated at that time I started the patient on amlodipine 5 mg daily and has since been titrated up to 10 mg daily.  In the interim the patient is able to wear her ZIO monitor as well as undergo her coronary CTA.  Today she follows up for discussion of her testing results.  She still is experiencing intermittent chest pain.  Virtual visit due to weather  Patient does not have any signs of COVID-19 infection.  Of note she was diagnosed with COVID-19 on January 9 and has recovered well.  Past Medical History:  Diagnosis Date  . HTN (hypertension)   . Hyperlipidemia    Past Surgical History:  Procedure Laterality Date  . CESAREAN SECTION    . CHOLECYSTECTOMY    . HYSTERECTOMY ABDOMINAL WITH SALPINGECTOMY    . SPINAL FUSION       Current Meds  Medication Sig  . amLODipine (NORVASC) 10 MG tablet Take 1 tablet (10 mg total) by mouth daily.  . ASPIRIN ADULT LOW STRENGTH 81 MG EC tablet Take 81 mg by mouth daily.  . calcium citrate-vitamin D (CITRACAL+D) 315-200 MG-UNIT tablet Take by mouth.  . cyclobenzaprine (FLEXERIL) 10 MG tablet Take 10 mg by mouth as needed for muscle spasms.  . meloxicam (MOBIC) 7.5 MG tablet Take 7.5 mg by mouth as needed.   . nitroGLYCERIN (NITROSTAT) 0.4 MG SL tablet Place 1 tablet (0.4 mg total) under the tongue every 5 (five) minutes as needed.  Marland Kitchen REPATHA SURECLICK 140 MG/ML SOAJ Inject 1 mL into the skin every 14 (fourteen) days.  . valACYclovir (VALTREX) 500 MG tablet as needed.  . Vitamin D, Ergocalciferol, (DRISDOL) 1.25 MG (50000 UNIT) CAPS capsule Take 50,000 Units by mouth once a week.     Allergies:   Morphine, Nitrofurantoin, Other, and Statins   Social History   Tobacco Use  . Smoking status: Never Smoker  . Smokeless tobacco: Never Used  Substance Use Topics  . Alcohol use: Not Currently    Comment: occasionally  . Drug use: Never     Family Hx: The patient's family history includes Breast cancer in her mother; CAD in her father; Congestive Heart Failure in her father; Diabetes in her brother and father; Healthy in her brother; Heart attack in her brother and sister; Hypertension in her father; Renal Disease in her father.  ROS:   Review of Systems  Constitution: Negative for decreased appetite, fever and weight gain.  HENT: Negative for congestion, ear discharge, hoarse voice and sore throat.   Eyes: Negative for discharge, redness, vision loss in right eye and visual halos.  Cardiovascular: Reports chest pain.  Negative for dyspnea on exertion, leg swelling, orthopnea and palpitations.  Respiratory: Negative  for cough, hemoptysis, shortness of breath and snoring.   Endocrine: Negative for heat intolerance and polyphagia.  Hematologic/Lymphatic: Negative for bleeding problem. Does not bruise/bleed easily.  Skin: Negative for flushing, nail changes, rash and suspicious lesions.  Musculoskeletal: Negative for arthritis, joint pain, muscle cramps, myalgias, neck pain and stiffness.  Gastrointestinal: Negative for abdominal pain, bowel incontinence, diarrhea and excessive appetite.  Genitourinary: Negative for decreased libido, genital sores and incomplete emptying.  Neurological:  Negative for brief paralysis, focal weakness, headaches and loss of balance.  Psychiatric/Behavioral: Negative for altered mental status, depression and suicidal ideas.  Allergic/Immunologic: Negative for HIV exposure and persistent infections.  .   Prior CV studies:   The following studies were reviewed today:  Coronary CTA IMPRESSION: August 09, 2019 1. Normal diameter ascending aorta 3.3 cm  2. Calcium score 118 which is 96 th percentile for age and sex. Dense calcium in proximal LAD  3. CAD RADS 3 50-69% calcified plaque in proximal LAD Study sent for FFR CT  FFR CT IMPRESSION Abnormal FFR CT see above Given location of dense calcific stenosis in proximal LAD and abnormal FFR CT would  Consider f/u diagnostic catheterization to further identify if PCI/DES warranted   Zio Monitor: The patient wore the monitor for 7 days starting June 24, 2019. Indication: Palpitations  The minimum heart rate was 40 bpm, maximum heart rate was 148 bpm, and average heart rate was 70 bpm. Predominant underlying rhythm was Sinus Rhythm.  6 Supraventricular Tachycardia runs occurred, the run with the fastest interval lasting 7 beats with a maximal rate of 148 bpm, the longest lasting 7 Beats.  Premature atrial complexes were rare (less than 1%). Premature Ventricular complexes were rare (less than 1%).  No ventricular tachycardia, no pauses, No AV block and no atrial fibrillation present. 3 patient triggered events: 1 associated with ventricular tachycardia, 2 associated with premature atrial complexes  Conclusion: This study is  symptomatic paroxysmal supraventricular tachycardia which is likely atrial tachycardia with AV block, and rare symptomatic premature atrial complexes.   Labs/Other Tests and Data Reviewed:    EKG: None today  Recent Labs: No results found for requested labs within last 8760 hours.   Recent Lipid Panel No results found for: CHOL, TRIG, HDL,  CHOLHDL, LDLCALC, LDLDIRECT  Wt Readings from Last 3 Encounters:  08/12/19 189 lb (85.7 kg)  06/24/19 193 lb 3.2 oz (87.6 kg)     Objective:    Vital Signs:  BP 132/82   Pulse (!) 54   Resp 16   Ht 5' 5" (1.651 m)   Wt 189 lb (85.7 kg)   BMI 31.45 kg/m    No physical exam performed today due to virtual visit.  ASSESSMENT & PLAN:    Abnormal FFR CT Coronary artery disease  Paroxysmal atrial tachycardia Hyperlipidemia Hypertension  Plan: I was able to speak to the patient about her abnormal coronary CTA as well as her abnormal FFR.  I did explain to her that the next step will be pursuing left heart catheterization that is as a possibility for revascularization with PCI.  She understands the reason for the recommendation.  All of her questions were answered.  The patient understands that risks include but are not limited to stroke (1 in 1000), death (1 in 1000), kidney failure [usually temporary] (1 in 500), bleeding (1 in 200), allergic reaction [possibly serious] (1 in 200), and agrees to proceed.  She is already on aspirin 81 mg daily along with Repatha we   will continue patient on this regimen for now.  I would like to add beta-blocker to her medication regimen in the setting of her coronary disease as well as her symptomatic paroxysmal atrial tachycardia.  The patient reports that recently her heart rate has been low averaging in the 50s.  Therefore as a result of this we will hold off on adding beta-blocker.  Hyperlipidemia-continue patient on Repatha  Hypertension-blood pressure acceptable today continue patient on her amlodipine 10 mg daily.    COVID-19 Education: The signs and symptoms of COVID-19 were discussed with the patient and how to seek care for testing (follow up with PCP or arrange E-visit).  The importance of social distancing was discussed today.  Time:   Today, I have spent 20 minutes with the patient with telehealth technology discussing the above  problems.     Medication Adjustments/Labs and Tests Ordered: Current medicines are reviewed at length with the patient today.  Concerns regarding medicines are outlined above.   Tests Ordered: Orders Placed This Encounter  Procedures  . CBC  . Basic Metabolic Panel (BMET)    Medication Changes: No orders of the defined types were placed in this encounter.   Follow Up:  1 month  Signed, Berniece Salines, DO  08/12/2019 11:19 AM    [place]

## 2019-08-12 NOTE — Progress Notes (Signed)
Telehealth visit  Date:  08/12/2019   ID:  Shirley, Barber [birth year], MRN [id number]  The patient is at home. The provider is at home.  PCP:  Pc, [facility]  Cardiologist:  Thomasene Ripple, DO  Electrophysiologist:  None   Evaluation Performed:  Follow up visit   Chief Complaint:  Chest pain, follow up to discuss testing result.  History of Present Illness:    Shirley Barber is a 54 y.o. female with a hx of hypertension, hyperlipidemia and premature coronary disease in her degree relatives. The patient presented initially on June 24, 2019 to be evaluated for chest pain and palpitations.  Patient has family history and her symptoms I recommended patient undergo a coronary CTA.  In addition her blood pressure was also elevated at that time I started the patient on amlodipine 5 mg daily and has since been titrated up to 10 mg daily.  In the interim the patient is able to wear her ZIO monitor as well as undergo her coronary CTA.  Today she follows up for discussion of her testing results.  She still is experiencing intermittent chest pain.  Virtual visit due to weather  Patient does not have any signs of COVID-19 infection.  Of note she was diagnosed with COVID-19 on January 9 and has recovered well.  Past Medical History:  Diagnosis Date  . HTN (hypertension)   . Hyperlipidemia    Past Surgical History:  Procedure Laterality Date  . CESAREAN SECTION    . CHOLECYSTECTOMY    . HYSTERECTOMY ABDOMINAL WITH SALPINGECTOMY    . SPINAL FUSION       Current Meds  Medication Sig  . amLODipine (NORVASC) 10 MG tablet Take 1 tablet (10 mg total) by mouth daily.  . ASPIRIN ADULT LOW STRENGTH 81 MG EC tablet Take 81 mg by mouth daily.  . calcium citrate-vitamin D (CITRACAL+D) 315-200 MG-UNIT tablet Take by mouth.  . cyclobenzaprine (FLEXERIL) 10 MG tablet Take 10 mg by mouth as needed for muscle spasms.  . meloxicam (MOBIC) 7.5 MG tablet Take 7.5 mg by mouth as needed.   . nitroGLYCERIN (NITROSTAT) 0.4 MG SL tablet Place 1 tablet (0.4 mg total) under the tongue every 5 (five) minutes as needed.  Marland Kitchen REPATHA SURECLICK 140 MG/ML SOAJ Inject 1 mL into the skin every 14 (fourteen) days.  . valACYclovir (VALTREX) 500 MG tablet as needed.  . Vitamin D, Ergocalciferol, (DRISDOL) 1.25 MG (50000 UNIT) CAPS capsule Take 50,000 Units by mouth once a week.     Allergies:   Morphine, Nitrofurantoin, Other, and Statins   Social History   Tobacco Use  . Smoking status: Never Smoker  . Smokeless tobacco: Never Used  Substance Use Topics  . Alcohol use: Not Currently    Comment: occasionally  . Drug use: Never     Family Hx: The patient's family history includes Breast cancer in her mother; CAD in her father; Congestive Heart Failure in her father; Diabetes in her brother and father; Healthy in her brother; Heart attack in her brother and sister; Hypertension in her father; Renal Disease in her father.  ROS:   Review of Systems  Constitution: Negative for decreased appetite, fever and weight gain.  HENT: Negative for congestion, ear discharge, hoarse voice and sore throat.   Eyes: Negative for discharge, redness, vision loss in right eye and visual halos.  Cardiovascular: Reports chest pain.  Negative for dyspnea on exertion, leg swelling, orthopnea and palpitations.  Respiratory: Negative  for cough, hemoptysis, shortness of breath and snoring.   Endocrine: Negative for heat intolerance and polyphagia.  Hematologic/Lymphatic: Negative for bleeding problem. Does not bruise/bleed easily.  Skin: Negative for flushing, nail changes, rash and suspicious lesions.  Musculoskeletal: Negative for arthritis, joint pain, muscle cramps, myalgias, neck pain and stiffness.  Gastrointestinal: Negative for abdominal pain, bowel incontinence, diarrhea and excessive appetite.  Genitourinary: Negative for decreased libido, genital sores and incomplete emptying.  Neurological:  Negative for brief paralysis, focal weakness, headaches and loss of balance.  Psychiatric/Behavioral: Negative for altered mental status, depression and suicidal ideas.  Allergic/Immunologic: Negative for HIV exposure and persistent infections.  .   Prior CV studies:   The following studies were reviewed today:  Coronary CTA IMPRESSION: August 09, 2019 1. Normal diameter ascending aorta 3.3 cm  2. Calcium score 118 which is 96 th percentile for age and sex. Dense calcium in proximal LAD  3. CAD RADS 3 50-69% calcified plaque in proximal LAD Study sent for FFR CT  FFR CT IMPRESSION Abnormal FFR CT see above Given location of dense calcific stenosis in proximal LAD and abnormal FFR CT would  Consider f/u diagnostic catheterization to further identify if PCI/DES warranted   Zio Monitor: The patient wore the monitor for 7 days starting June 24, 2019. Indication: Palpitations  The minimum heart rate was 40 bpm, maximum heart rate was 148 bpm, and average heart rate was 70 bpm. Predominant underlying rhythm was Sinus Rhythm.  6 Supraventricular Tachycardia runs occurred, the run with the fastest interval lasting 7 beats with a maximal rate of 148 bpm, the longest lasting 7 Beats.  Premature atrial complexes were rare (less than 1%). Premature Ventricular complexes were rare (less than 1%).  No ventricular tachycardia, no pauses, No AV block and no atrial fibrillation present. 3 patient triggered events: 1 associated with ventricular tachycardia, 2 associated with premature atrial complexes  Conclusion: This study is  symptomatic paroxysmal supraventricular tachycardia which is likely atrial tachycardia with AV block, and rare symptomatic premature atrial complexes.   Labs/Other Tests and Data Reviewed:    EKG: None today  Recent Labs: No results found for requested labs within last 8760 hours.   Recent Lipid Panel No results found for: CHOL, TRIG, HDL,  CHOLHDL, LDLCALC, LDLDIRECT  Wt Readings from Last 3 Encounters:  08/12/19 189 lb (85.7 kg)  06/24/19 193 lb 3.2 oz (87.6 kg)     Objective:    Vital Signs:  BP 132/82   Pulse (!) 54   Resp 16   Ht 5\' 5"  (1.651 m)   Wt 189 lb (85.7 kg)   BMI 31.45 kg/m    No physical exam performed today due to virtual visit.  ASSESSMENT & PLAN:    Abnormal FFR CT Coronary artery disease  Paroxysmal atrial tachycardia Hyperlipidemia Hypertension  Plan: I was able to speak to the patient about her abnormal coronary CTA as well as her abnormal FFR.  I did explain to her that the next step will be pursuing left heart catheterization that is as a possibility for revascularization with PCI.  She understands the reason for the recommendation.  All of her questions were answered.  The patient understands that risks include but are not limited to stroke (1 in 1000), death (1 in 1000), kidney failure [usually temporary] (1 in 500), bleeding (1 in 200), allergic reaction [possibly serious] (1 in 200), and agrees to proceed.  She is already on aspirin 81 mg daily along with Repatha we  will continue patient on this regimen for now.  I would like to add beta-blocker to her medication regimen in the setting of her coronary disease as well as her symptomatic paroxysmal atrial tachycardia.  The patient reports that recently her heart rate has been low averaging in the 50s.  Therefore as a result of this we will hold off on adding beta-blocker.  Hyperlipidemia-continue patient on Repatha  Hypertension-blood pressure acceptable today continue patient on her amlodipine 10 mg daily.    COVID-19 Education: The signs and symptoms of COVID-19 were discussed with the patient and how to seek care for testing (follow up with PCP or arrange E-visit).  The importance of social distancing was discussed today.  Time:   Today, I have spent 20 minutes with the patient with telehealth technology discussing the above  problems.     Medication Adjustments/Labs and Tests Ordered: Current medicines are reviewed at length with the patient today.  Concerns regarding medicines are outlined above.   Tests Ordered: Orders Placed This Encounter  Procedures  . CBC  . Basic Metabolic Panel (BMET)    Medication Changes: No orders of the defined types were placed in this encounter.   Follow Up:  1 month  Signed, Berniece Salines, DO  08/12/2019 11:19 AM    [place]

## 2019-08-12 NOTE — Addendum Note (Signed)
Addended by: Thomasene Ripple on: 08/12/2019 11:22 AM   Modules accepted: Orders, SmartSet

## 2019-08-12 NOTE — Patient Instructions (Signed)
Medication Instructions:  Your physician recommends that you continue on your current medications as directed. Please refer to the Current Medication list given to you today.  *If you need a refill on your cardiac medications before your next appointment, please call your pharmacy*  Lab Work: Your physician recommends that you return for lab work in: NEXT WEEK CBC,BMP  If you have labs (blood work) drawn today and your tests are completely normal, you will receive your results only by: Marland Kitchen MyChart Message (if you have MyChart) OR . A paper copy in the mail If you have any lab test that is abnormal or we need to change your treatment, we will call you to review the results.  Testing/Procedures:    [facility]ARD[facility]OVASCULAR [facility] [place] [address] [place] [address] Dept: [phone] Loc: [phone]  JIMEKA BALAN  08/12/2019  You are scheduled for a Cardiac Catheterization on Friday, February 26 with Dr. Cristal [place].  1. Please arrive at the [facility] (Main Entrance A) at [facility]: [address] [place], [place] [address] at 8:30 AM (This time is two hours before your procedure to ensure your preparation). Free valet parking service is available.   Special note: Every effort is made to have your procedure done on time. Please understand that emergencies sometimes delay scheduled procedures.  2. Diet: Do not eat solid foods after midnight.  The patient may have clear liquids until 5am upon the day of the procedure.  3. Labs: You will need to have blood drawn on NEXT WEEK: CBC,BMP 4. Medication instructions in preparation for your procedure:   Contrast Allergy: No  On the morning of your procedure, take your Aspirin and any morning medicines NOT listed above.  You may use sips of water.  5. Plan for one night stay--bring personal belongings. 6. Bring a current list of your medications and current  insurance cards. 7. You MUST have a responsible person to drive you home. 8. Someone MUST be with you the first 24 hours after you arrive home or your discharge will be delayed. 9. Please wear clothes that are easy to get on and off and wear slip-on shoes.  Thank you for allowing [place] to care for you!   -- [facility]   Follow-Up: At [facility], you and your health needs are our priority.  As part of our continuing mission to provide you with exceptional heart care, we have created designated Provider Care Teams.  These Care Teams include your primary Cardiologist (physician) and Advanced Practice Providers (APPs -  Physician Assistants and Nurse Practitioners) who all work together to provide you with the care you need, when you need it.  Your next appointment:    KEEP 08/20/19 APPOINTMENT  The format for your next appointment:   In Person  Provider:   Thomasene Ripple, DO  Other Instructions

## 2019-08-16 ENCOUNTER — Ambulatory Visit (INDEPENDENT_AMBULATORY_CARE_PROVIDER_SITE_OTHER): Payer: No Typology Code available for payment source

## 2019-08-16 ENCOUNTER — Other Ambulatory Visit: Payer: Self-pay

## 2019-08-16 DIAGNOSIS — R079 Chest pain, unspecified: Secondary | ICD-10-CM

## 2019-08-16 DIAGNOSIS — I10 Essential (primary) hypertension: Secondary | ICD-10-CM | POA: Diagnosis not present

## 2019-08-16 NOTE — Progress Notes (Signed)
Complete echocardiogram has been performed.  Jimmy Mateja Dier RDCS, RVT 

## 2019-08-17 LAB — BASIC METABOLIC PANEL
BUN/Creatinine Ratio: 21 (ref 9–23)
BUN: 23 mg/dL (ref 6–24)
CO2: 27 mmol/L (ref 20–29)
Calcium: 9.9 mg/dL (ref 8.7–10.2)
Chloride: 103 mmol/L (ref 96–106)
Creatinine, Ser: 1.09 mg/dL — ABNORMAL HIGH (ref 0.57–1.00)
GFR calc Af Amer: 67 mL/min/{1.73_m2} (ref >59)
GFR calc non Af Amer: 58 mL/min/{1.73_m2} — ABNORMAL LOW (ref >59)
Glucose: 93 mg/dL (ref 65–99)
Potassium: 4.3 mmol/L (ref 3.5–5.2)
Sodium: 142 mmol/L (ref 134–144)

## 2019-08-17 LAB — CBC
Hematocrit: 39.6 % (ref 34.0–46.6)
Hemoglobin: 13.4 g/dL (ref 11.1–15.9)
MCH: 30.3 pg (ref 26.6–33.0)
MCHC: 33.8 g/dL (ref 31.5–35.7)
MCV: 90 fL (ref 79–97)
Platelets: 298 10*3/uL (ref 150–450)
RBC: 4.42 x10E6/uL (ref 3.77–5.28)
RDW: 12.4 % (ref 11.7–15.4)
WBC: 7.7 10*3/uL (ref 3.4–10.8)

## 2019-08-19 ENCOUNTER — Telehealth: Payer: Self-pay | Admitting: *Deleted

## 2019-08-19 NOTE — Telephone Encounter (Signed)
-----   Message from Thomasene Ripple, DO sent at 08/19/2019  8:43 AM EST ----- Creatinine slightly elevated.

## 2019-08-19 NOTE — Telephone Encounter (Signed)
-----   Message from Thomasene Ripple, DO sent at 08/19/2019  9:57 AM EST ----- The echo showed that the heart is not fully relaxing like it should ( diastolic dysfunction) ,but otherwise normal. I will discuss it at the next office visit.

## 2019-08-19 NOTE — Telephone Encounter (Addendum)
Pt contacted pre-catheterization scheduled at [facility] for: Friday August 20, 2019 10:30 AM Verified arrival time and place: [facility] Main Entrance A [facility]) at: 8:30 AM   No solid food after midnight prior to cath, clear liquids until 5 AM day of procedure. Contrast allergy: no  AM meds can be  taken pre-cath with sip of water including: ASA 81 mg   Confirmed patient has responsible adult to drive home post procedure and observe 24 hours after arriving home: yes  Currently, due to Covid-19 pandemic, only one person will be allowed with patient. Must be the same person for patient's entire stay and will be required to wear a mask. They will be asked to wait in the waiting room for the duration of the patient's stay.  Patients are required to wear a mask when they enter the hospital.  I reviewed procedure/mask/visitor instructions with patient, she verbalized understanding, thanked me for call.  Pt COVID-19 positive 07/13/19 (scanned in Epic under Lab)-at time pt states fever, body aches, loss of sense of smell. Pt states sense of smell has improved but still some loss, denies any other COVID-19 symptoms at this time,  Including fever.

## 2019-08-19 NOTE — Telephone Encounter (Signed)
Patient informed and copy sent to PCP. 

## 2019-08-20 ENCOUNTER — Other Ambulatory Visit: Payer: Self-pay

## 2019-08-20 ENCOUNTER — Ambulatory Visit: Payer: No Typology Code available for payment source | Admitting: Cardiology

## 2019-08-20 ENCOUNTER — Encounter (HOSPITAL_COMMUNITY)
Admission: RE | Disposition: A | Discharge status: Home / Self Care | Payer: No Typology Code available for payment source | Source: Home / Self Care | Attending: Internal Medicine

## 2019-08-20 ENCOUNTER — Ambulatory Visit (HOSPITAL_COMMUNITY)
Admission: RE | Admit: 2019-08-20 | Discharge: 2019-08-20 | Disposition: A | Discharge status: Home / Self Care | Payer: No Typology Code available for payment source | Attending: Internal Medicine | Admitting: Internal Medicine

## 2019-08-20 DIAGNOSIS — R931 Abnormal findings on diagnostic imaging of heart and coronary circulation: Secondary | ICD-10-CM

## 2019-08-20 DIAGNOSIS — I471 Supraventricular tachycardia: Secondary | ICD-10-CM | POA: Insufficient documentation

## 2019-08-20 DIAGNOSIS — Z7982 Long term (current) use of aspirin: Secondary | ICD-10-CM | POA: Insufficient documentation

## 2019-08-20 DIAGNOSIS — Z79899 Other long term (current) drug therapy: Secondary | ICD-10-CM | POA: Insufficient documentation

## 2019-08-20 DIAGNOSIS — Z885 Allergy status to narcotic agent status: Secondary | ICD-10-CM | POA: Diagnosis not present

## 2019-08-20 DIAGNOSIS — I10 Essential (primary) hypertension: Secondary | ICD-10-CM | POA: Insufficient documentation

## 2019-08-20 DIAGNOSIS — Z881 Allergy status to other antibiotic agents status: Secondary | ICD-10-CM | POA: Insufficient documentation

## 2019-08-20 DIAGNOSIS — I25118 Atherosclerotic heart disease of native coronary artery with other forms of angina pectoris: Secondary | ICD-10-CM | POA: Insufficient documentation

## 2019-08-20 DIAGNOSIS — Z888 Allergy status to other drugs, medicaments and biological substances status: Secondary | ICD-10-CM | POA: Diagnosis not present

## 2019-08-20 DIAGNOSIS — E785 Hyperlipidemia, unspecified: Secondary | ICD-10-CM | POA: Diagnosis not present

## 2019-08-20 HISTORY — PX: LEFT HEART CATH AND CORONARY ANGIOGRAPHY: CATH118249

## 2019-08-20 HISTORY — PX: CORONARY PRESSURE/FFR STUDY: CATH118243

## 2019-08-20 LAB — POCT ACTIVATED CLOTTING TIME: Activated Clotting Time: 296 seconds

## 2019-08-20 SURGERY — LEFT HEART CATH AND CORONARY ANGIOGRAPHY
Anesthesia: LOCAL

## 2019-08-20 MED ORDER — VERAPAMIL HCL 2.5 MG/ML IV SOLN
INTRAVENOUS | Status: AC
  Filled 2019-08-20: qty 2

## 2019-08-20 MED ORDER — FAMOTIDINE IN NACL 20-0.9 MG/50ML-% IV SOLN
INTRAVENOUS | Status: AC
  Filled 2019-08-20: qty 50

## 2019-08-20 MED ORDER — SODIUM CHLORIDE 0.9% FLUSH: 3.0000 mL | Freq: Two times a day (BID) | INTRAVENOUS | Status: DC

## 2019-08-20 MED ORDER — IOHEXOL 350 MG/ML SOLN
INTRAVENOUS | Status: DC | PRN
  Administered 2019-08-20: 75 mL

## 2019-08-20 MED ORDER — SODIUM CHLORIDE 0.9% FLUSH: 3.0000 mL | INTRAVENOUS | Status: DC | PRN

## 2019-08-20 MED ORDER — SODIUM CHLORIDE 0.9 % WEIGHT BASED INFUSION: 1.0000 mL/kg/h | INTRAVENOUS | Status: DC

## 2019-08-20 MED ORDER — FAMOTIDINE IN NACL 20-0.9 MG/50ML-% IV SOLN
INTRAVENOUS | Status: AC | PRN
  Administered 2019-08-20: 20 mg via INTRAVENOUS

## 2019-08-20 MED ORDER — CLOPIDOGREL BISULFATE 300 MG PO TABS
ORAL_TABLET | ORAL | Status: DC | PRN
  Administered 2019-08-20: 600 mg via ORAL

## 2019-08-20 MED ORDER — ONDANSETRON HCL 4 MG/2ML IJ SOLN: 4.0000 mg | Freq: Four times a day (QID) | INTRAMUSCULAR | Status: DC | PRN

## 2019-08-20 MED ORDER — LABETALOL HCL 5 MG/ML IV SOLN: 10.0000 mg | INTRAVENOUS | Status: DC | PRN

## 2019-08-20 MED ORDER — VERAPAMIL HCL 2.5 MG/ML IV SOLN
INTRAVENOUS | Status: DC | PRN
  Administered 2019-08-20: 10 mL via INTRA_ARTERIAL

## 2019-08-20 MED ORDER — NITROGLYCERIN 1 MG/10 ML FOR IR/CATH LAB
INTRA_ARTERIAL | Status: DC | PRN
  Administered 2019-08-20: 200 ug via INTRACORONARY

## 2019-08-20 MED ORDER — LIDOCAINE HCL (PF) 1 % IJ SOLN
INTRAMUSCULAR | Status: DC | PRN
  Administered 2019-08-20: 2 mL

## 2019-08-20 MED ORDER — ADENOSINE 12 MG/4ML IV SOLN
INTRAVENOUS | Status: AC
  Filled 2019-08-20: qty 16

## 2019-08-20 MED ORDER — FENTANYL CITRATE (PF) 100 MCG/2ML IJ SOLN
INTRAMUSCULAR | Status: DC | PRN
  Administered 2019-08-20 (×2): 25 ug via INTRAVENOUS

## 2019-08-20 MED ORDER — CLOPIDOGREL BISULFATE 300 MG PO TABS
ORAL_TABLET | ORAL | Status: AC
  Filled 2019-08-20: qty 2

## 2019-08-20 MED ORDER — SODIUM CHLORIDE 0.9 % IV SOLN: INTRAVENOUS | Status: DC

## 2019-08-20 MED ORDER — ASPIRIN 81 MG PO CHEW: 81.0000 mg | CHEWABLE_TABLET | Freq: Every day | ORAL | Status: DC

## 2019-08-20 MED ORDER — LIDOCAINE HCL (PF) 1 % IJ SOLN
INTRAMUSCULAR | Status: AC
  Filled 2019-08-20: qty 30

## 2019-08-20 MED ORDER — NITROGLYCERIN 1 MG/10 ML FOR IR/CATH LAB
INTRA_ARTERIAL | Status: AC
  Filled 2019-08-20: qty 10

## 2019-08-20 MED ORDER — SODIUM CHLORIDE 0.9 % IV SOLN: 250.0000 mL | INTRAVENOUS | Status: DC | PRN

## 2019-08-20 MED ORDER — HYDRALAZINE HCL 20 MG/ML IJ SOLN: 10.0000 mg | INTRAMUSCULAR | Status: DC | PRN

## 2019-08-20 MED ORDER — HEPARIN (PORCINE) IN NACL 1000-0.9 UT/500ML-% IV SOLN
INTRAVENOUS | Status: AC
  Filled 2019-08-20: qty 1000

## 2019-08-20 MED ORDER — ACETAMINOPHEN 325 MG PO TABS: 650.0000 mg | ORAL_TABLET | ORAL | Status: DC | PRN

## 2019-08-20 MED ORDER — MIDAZOLAM HCL 2 MG/2ML IJ SOLN
INTRAMUSCULAR | Status: AC
  Filled 2019-08-20: qty 2

## 2019-08-20 MED ORDER — HEPARIN SODIUM (PORCINE) 1000 UNIT/ML IJ SOLN
INTRAMUSCULAR | Status: AC
  Filled 2019-08-20: qty 1

## 2019-08-20 MED ORDER — ADENOSINE (DIAGNOSTIC) 140MCG/KG/MIN
INTRAVENOUS | Status: DC | PRN
  Administered 2019-08-20: 140 ug/kg/min via INTRAVENOUS

## 2019-08-20 MED ORDER — SODIUM CHLORIDE 0.9 % WEIGHT BASED INFUSION
3.0000 mL/kg/h | INTRAVENOUS | Status: AC
  Administered 2019-08-20: 3 mL/kg/h via INTRAVENOUS

## 2019-08-20 MED ORDER — HEPARIN SODIUM (PORCINE) 1000 UNIT/ML IJ SOLN
INTRAMUSCULAR | Status: DC | PRN
  Administered 2019-08-20 (×2): 4500 [IU] via INTRAVENOUS

## 2019-08-20 MED ORDER — FENTANYL CITRATE (PF) 100 MCG/2ML IJ SOLN
INTRAMUSCULAR | Status: AC
  Filled 2019-08-20: qty 2

## 2019-08-20 MED ORDER — HEPARIN (PORCINE) IN NACL 1000-0.9 UT/500ML-% IV SOLN
INTRAVENOUS | Status: DC | PRN
  Administered 2019-08-20 (×2): 500 mL

## 2019-08-20 MED ORDER — MIDAZOLAM HCL 2 MG/2ML IJ SOLN
INTRAMUSCULAR | Status: DC | PRN
  Administered 2019-08-20 (×2): 1 mg via INTRAVENOUS

## 2019-08-20 MED ORDER — ISOSORBIDE MONONITRATE ER 30 MG PO TB24: 15.0000 mg | ORAL_TABLET | Freq: Every day | ORAL | 5 refills | Status: DC

## 2019-08-20 SURGICAL SUPPLY — 16 items
BALLN EMERGE MR 2.25X12 (BALLOONS) ×2
BALLOON EMERGE MR 2.25X12 (BALLOONS) IMPLANT
CATH INFINITI 5FR ANG PIGTAIL (CATHETERS) ×1 IMPLANT
CATH OPTITORQUE TIG 4.0 5F (CATHETERS) ×1 IMPLANT
CATH VISTA GUIDE 6FR XBLAD3.0 (CATHETERS) ×1 IMPLANT
DEVICE RAD COMP TR BAND LRG (VASCULAR PRODUCTS) ×1 IMPLANT
GLIDESHEATH SLEND SS 6F .021 (SHEATH) ×1 IMPLANT
GUIDEWIRE INQWIRE 1.5J.035X260 (WIRE) IMPLANT
GUIDEWIRE PRESSURE COMET II (WIRE) ×1 IMPLANT
INQWIRE 1.5J .035X260CM (WIRE) ×2
KIT ENCORE 26 ADVANTAGE (KITS) ×1 IMPLANT
KIT HEART LEFT (KITS) ×2 IMPLANT
PACK CARDIAC CATHETERIZATION (CUSTOM PROCEDURE TRAY) ×2 IMPLANT
TRANSDUCER W/STOPCOCK (MISCELLANEOUS) ×2 IMPLANT
TUBING CIL FLEX 10 FLL-RA (TUBING) ×2 IMPLANT
WIRE RUNTHROUGH .014X180CM (WIRE) ×1 IMPLANT

## 2019-08-20 NOTE — Discharge Instructions (Signed)
Radial Site Care  This sheet gives you information about how to care for yourself after your procedure. Your health care provider may also give you more specific instructions. If you have problems or questions, contact your health care provider. What can I expect after the procedure? After the procedure, it is common to have:  Bruising and tenderness at the catheter insertion area. Follow these instructions at home: Medicines  Take over-the-counter and prescription medicines only as told by your health care provider. Insertion site care  Follow instructions from your health care provider about how to take care of your insertion site. Make sure you: ? Wash your hands with soap and water before you change your bandage (dressing). If soap and water are not available, use hand sanitizer. ? Change your dressing as told by your health care provider. ? Leave stitches (sutures), skin glue, or adhesive strips in place. These skin closures may need to stay in place for 2 weeks or longer. If adhesive strip edges start to loosen and curl up, you may trim the loose edges. Do not remove adhesive strips completely unless your health care provider tells you to do that.  Check your insertion site every day for signs of infection. Check for: ? Redness, swelling, or pain. ? Fluid or blood. ? Pus or a bad smell. ? Warmth.  Do not take baths, swim, or use a hot tub until your health care provider approves.  You may shower 24-48 hours after the procedure, or as directed by your health care provider. ? Remove the dressing and gently wash the site with plain soap and water. ? Pat the area dry with a clean towel. ? Do not rub the site. That could cause bleeding.  Do not apply powder or lotion to the site. Activity   For 24 hours after the procedure, or as directed by your health care provider: ? Do not flex or bend the affected arm. ? Do not push or pull heavy objects with the affected arm. ? Do not  drive yourself home from the hospital or clinic. You may drive 24 hours after the procedure unless your health care provider tells you not to. ? Do not operate machinery or power tools.  Do not lift anything that is heavier than 10 lb (4.5 kg), or the limit that you are told, until your health care provider says that it is safe.  Ask your health care provider when it is okay to: ? Return to work or school. ? Resume usual physical activities or sports. ? Resume sexual activity. General instructions  If the catheter site starts to bleed, raise your arm and put firm pressure on the site. If the bleeding does not stop, get help right away. This is a medical emergency.  If you went home on the same day as your procedure, a responsible adult should be with you for the first 24 hours after you arrive home.  Keep all follow-up visits as told by your health care provider. This is important. Contact a health care provider if:  You have a fever.  You have redness, swelling, or yellow drainage around your insertion site. Get help right away if:  You have unusual pain at the radial site.  The catheter insertion area swells very fast.  The insertion area is bleeding, and the bleeding does not stop when you hold steady pressure on the area.  Your arm or hand becomes pale, cool, tingly, or numb. These symptoms may represent a serious problem   that is an emergency. Do not wait to see if the symptoms will go away. Get medical help right away. Call your local emergency services (911 in the [place] Do not drive yourself to the hospital. Summary  After the procedure, it is common to have bruising and tenderness at the site.  Follow instructions from your health care provider about how to take care of your radial site wound. Check the wound every day for signs of infection.  Do not lift anything that is heavier than 10 lb (4.5 kg), or the limit that you are told, until your health care provider says  that it is safe. This information is not intended to replace advice given to you by your health care provider. Make sure you discuss any questions you have with your health care provider. Document Revised: 07/16/2017 Document Reviewed: 07/16/2017 Elsevier Patient Education  2020 Elsevier Inc.  

## 2019-08-20 NOTE — Interval H&P Note (Signed)
History and Physical Interval Note:  08/20/2019 10:09 AM  Shirley Barber  has presented today for cardiac catheterization, with the diagnosis of accelerating angina and abnormal cardiac CTA.  The various methods of treatment have been discussed with the patient and family. After consideration of risks, benefits and other options for treatment, the patient has consented to  Procedure(s): LEFT HEART CATH AND CORONARY ANGIOGRAPHY (N/A) as a surgical intervention.  The patient's history has been reviewed, patient examined, no change in status, stable for surgery.  I have reviewed the patient's chart and labs.  Questions were answered to the patient's satisfaction.    Cath Lab Visit (complete for each Cath Lab visit)  Clinical Evaluation Leading to the Procedure:   ACS: No.  Non-ACS:    Anginal Classification: CCS IV  Anti-ischemic medical therapy: Minimal Therapy (1 class of medications)  Non-Invasive Test Results: Intermediate-risk stress test findings: cardiac mortality 1-3%/year; cardiac CTA with hemodynamically significant mid/distal LAD disease.  Prior CABG: No previous CABG  Shirley Barber

## 2019-08-20 NOTE — Brief Op Note (Signed)
BRIEF CARDIAC CATHETERIZATION NOTE  Date: 08/20/2019  Time: 11:28 AM  PATIENT:  Shirley Barber  54 y.o. female  PRE-OPERATIVE DIAGNOSIS:  Stable angina and abnormal cardiac CTA  POST-OPERATIVE DIAGNOSIS:  Same  PROCEDURE:  Procedure(s): LEFT HEART CATH AND CORONARY ANGIOGRAPHY (N/A) INTRAVASCULAR PRESSURE WIRE/FFR STUDY (N/A)  SURGEON:  Surgeon(s) and Role:    * Ajeenah Heiny, MD - Primary  FINDINGS: 1. Moderate single vessel coronary artery disease with sequential 50-60% mid LAD stenoses that are not hemodynamically significant (DFR = 0.93, FFR 0.84).  No significant disease noted in the LCx or RCA. 2. Normal LVEDP.  RECOMMENDATIONS: 1. Continue medical therapy and risk factor modification to prevent progression of disease. 2. I will add isosorbide mononitrate 15 mg daily, to be escalated as tolerated for relief of chest pain.  Yvonne Kendall, MD [facility]

## 2019-09-09 ENCOUNTER — Telehealth: Payer: Self-pay | Admitting: Cardiology

## 2019-09-09 MED ORDER — ISOSORBIDE MONONITRATE ER 30 MG PO TB24: 30.0000 mg | ORAL_TABLET | Freq: Every day | ORAL | 5 refills | Status: DC

## 2019-09-09 NOTE — Telephone Encounter (Signed)
Called pt back re: refill request for Imdur.  She stated that Dr. Okey Dupre had advised her to start with 15 mg and if she tolerated that, she could go to 30 mg daily.  He only called in 30 mg taking 1/2 tablet and pt is now out. She states that she is tolerating the 30 mg good and would like to continue.   Will send to Dr. Servando Salina for permission to continue 30 mg daily and send in prescription, per pt request, to [facility].  Pt is scheduled for follow-up with Dr. Servando Salina 09/13/2019.  Please advise!

## 2019-09-09 NOTE — Telephone Encounter (Signed)
Yes that will be fine to send the prescription: Imdur 30 mg daily.

## 2019-09-09 NOTE — Telephone Encounter (Signed)
*  STAT* If patient is at the pharmacy, call can be transferred to refill team.   1. Which medications need to be refilled? (please list name of each medication and dose if known) Isosorbide 30 mg  2. Which pharmacy/location (including street and city if local pharmacy) is medication to be sent to? [facility]  3. Do they need a 30 day or 90 day supply? 30 but they only give her 15 pills and that is in half. Please see the note that Dr. Okey Dupre has after her 08/20/19 apt. Please call patient if any questions.

## 2019-09-09 NOTE — Telephone Encounter (Signed)
Returned call to pt and she has been made aware that Dr. Servando Salina approved the Imdur 30 mg daily and that the rx has been sent to [facility]. Pt very grateful for the call back.

## 2019-09-13 ENCOUNTER — Ambulatory Visit: Payer: No Typology Code available for payment source | Admitting: Cardiology

## 2019-09-13 ENCOUNTER — Other Ambulatory Visit: Payer: Self-pay

## 2019-09-13 ENCOUNTER — Encounter: Payer: Self-pay | Admitting: Cardiology

## 2019-09-13 VITALS — BP 120/86 | HR 69 | Ht 65.0 in | Wt 195.0 lb

## 2019-09-13 DIAGNOSIS — E669 Obesity, unspecified: Secondary | ICD-10-CM | POA: Insufficient documentation

## 2019-09-13 DIAGNOSIS — I10 Essential (primary) hypertension: Secondary | ICD-10-CM | POA: Diagnosis not present

## 2019-09-13 DIAGNOSIS — I25118 Atherosclerotic heart disease of native coronary artery with other forms of angina pectoris: Secondary | ICD-10-CM | POA: Diagnosis not present

## 2019-09-13 DIAGNOSIS — E782 Mixed hyperlipidemia: Secondary | ICD-10-CM | POA: Diagnosis not present

## 2019-09-13 DIAGNOSIS — I471 Supraventricular tachycardia: Secondary | ICD-10-CM

## 2019-09-13 HISTORY — DX: Mixed hyperlipidemia: E78.2

## 2019-09-13 HISTORY — DX: Obesity, unspecified: E66.9

## 2019-09-13 MED ORDER — METOPROLOL SUCCINATE ER 25 MG PO TB24: 12.5000 mg | ORAL_TABLET | Freq: Every day | ORAL | 3 refills | Status: DC

## 2019-09-13 NOTE — Progress Notes (Signed)
Cardiology Office Note:    Date:  09/13/2019   ID:  Shirley Barber, DOB 1965-12-19, MRN [id number]  PCP:  Shirley Barber, [facility]  Cardiologist:  Berniece Salines, DO  Electrophysiologist:  None   Referring MD: Pc, [facility]*   Follow up visit   History of Present Illness:    Shirley Barber is a 54 y.o. female with a hx of coronary artery disease based on coronary CTA with abnormal FFR the patient did undergo left heart catheterization on August 20, 2019 at that time she was noted to have sequential 25%, 50% 60% proximal and mid LAD disease which was not dynamically significant for stent, hypertension, hyperlipidemia.  Patient is here today for follow-up visit. She tells me that she has been experiencing significant leg swelling on her amlodipine and prefer to get off this medication.  Since her catheterization she has had some improvement in her chest pain on the Imdur 30 mg daily.  No other complaints at this time.  Past Medical History:  Diagnosis Date  . Abnormal FFR CT 08/12/2019  . Chest pain 06/24/2019  . Coronary artery disease of native artery of native heart with stable angina pectoris ([place]) 08/12/2019  . HTN (hypertension)   . Hyperlipidemia   . Hypertension 06/24/2019  . Palpitations 06/24/2019  . PAT (paroxysmal atrial tachycardia) ([place]) 08/12/2019  . Pre-procedure lab exam 08/12/2019  . Shortness of breath 06/24/2019    Past Surgical History:  Procedure Laterality Date  . CESAREAN SECTION    . CHOLECYSTECTOMY    . HYSTERECTOMY ABDOMINAL WITH SALPINGECTOMY    . INTRAVASCULAR PRESSURE WIRE/FFR STUDY N/A 08/20/2019   Procedure: INTRAVASCULAR PRESSURE WIRE/FFR STUDY;  Surgeon: Nelva Bush, MD;  Location: [place] [facility];  Service: Cardiovascular;  Laterality: N/A;  . LEFT HEART CATH AND CORONARY ANGIOGRAPHY N/A 08/20/2019   Procedure: LEFT HEART CATH AND CORONARY ANGIOGRAPHY;  Surgeon: Nelva Bush, MD;  Location: [place] [facility];  Service:  Cardiovascular;  Laterality: N/A;  . SPINAL FUSION      Current Medications: Current Meds  Medication Sig  . ASPIRIN ADULT LOW STRENGTH 81 MG EC tablet Take 81 mg by mouth daily.  Marland Kitchen b complex vitamins tablet Take 1 tablet by mouth daily.  . cyclobenzaprine (FLEXERIL) 10 MG tablet Take 10 mg by mouth daily as needed for muscle spasms.   . isosorbide mononitrate (IMDUR) 30 MG 24 hr tablet Take 1 tablet (30 mg total) by mouth daily.  . meloxicam (MOBIC) 7.5 MG tablet Take 7.5 mg by mouth daily as needed for pain.   . nitroGLYCERIN (NITROSTAT) 0.4 MG SL tablet Place 1 tablet (0.4 mg total) under the tongue every 5 (five) minutes as needed. (Patient taking differently: Place 0.4 mg under the tongue every 5 (five) minutes as needed for chest pain. )  . REPATHA SURECLICK 875 MG/ML SOAJ Inject 1 mL into the skin every 14 (fourteen) days.  . valACYclovir (VALTREX) 500 MG tablet Take 500 mg by mouth as needed (outbreak).   . Vitamin D, Ergocalciferol, (DRISDOL) 1.25 MG (50000 UNIT) CAPS capsule Take 50,000 Units by mouth once a week. Monday  . [DISCONTINUED] amLODipine (NORVASC) 10 MG tablet Take 1 tablet (10 mg total) by mouth daily.     Allergies:   Morphine, Nitrofurantoin, Other, and Statins   Social History   Socioeconomic History  . Marital status: Married    Spouse name: Not on file  . Number of children: Not on file  .  Years of education: Not on file  . Highest education level: Not on file  Occupational History  . Not on file  Tobacco Use  . Smoking status: Never Smoker  . Smokeless tobacco: Never Used  Substance and Sexual Activity  . Alcohol use: Not Currently    Comment: occasionally  . Drug use: Never  . Sexual activity: Not on file  Other Topics Concern  . Not on file  Social History Narrative  . Not on file   Social Determinants of Health   Financial Resource Strain:   . Difficulty of Paying Living Expenses:   Food Insecurity:   . Worried About Programme researcher, broadcasting/film/videounning Out of Food  in the Last Year:   . Baristaan Out of Food in the Last Year:   Transportation Needs:   . Freight forwarderLack of Transportation (Medical):   Marland Kitchen. Lack of Transportation (Non-Medical):   Physical Activity:   . Days of Exercise per Week:   . Minutes of Exercise per Session:   Stress:   . Feeling of Stress :   Social Connections:   . Frequency of Communication with Friends and Family:   . Frequency of Social Gatherings with Friends and Family:   . Attends Religious Services:   . Active Member of Clubs or Organizations:   . Attends BankerClub or Organization Meetings:   Marland Kitchen. Marital Status:      Family History: The patient's family history includes Breast cancer in her mother; CAD in her father; Congestive Heart Failure in her father; Diabetes in her brother and father; Healthy in her brother; Heart attack in her brother and sister; Hypertension in her father; Renal Disease in her father.  ROS:   Review of Systems  Constitution: Negative for decreased appetite, fever and weight gain.  HENT: Negative for congestion, ear discharge, hoarse voice and sore throat.   Eyes: Negative for discharge, redness, vision loss in right eye and visual halos.  Cardiovascular: Negative for chest pain, dyspnea on exertion, leg swelling, orthopnea and palpitations.  Respiratory: Negative for cough, hemoptysis, shortness of breath and snoring.   Endocrine: Negative for heat intolerance and polyphagia.  Hematologic/Lymphatic: Negative for bleeding problem. Does not bruise/bleed easily.  Skin: Negative for flushing, nail changes, rash and suspicious lesions.  Musculoskeletal: Negative for arthritis, joint pain, muscle cramps, myalgias, neck pain and stiffness.  Gastrointestinal: Negative for abdominal pain, bowel incontinence, diarrhea and excessive appetite.  Genitourinary: Negative for decreased libido, genital sores and incomplete emptying.  Neurological: Negative for brief paralysis, focal weakness, headaches and loss of balance.    Psychiatric/Behavioral: Negative for altered mental status, depression and suicidal ideas.  Allergic/Immunologic: Negative for HIV exposure and persistent infections.    EKGs/Labs/Other Studies Reviewed:    The following studies were reviewed today:   EKG: None today  LHC 08/20/2019 Conclusions: 1. Moderate single vessel coronary artery disease with sequential 25%, 50%, and 60% proximal and mid LAD stenoses.  The disease is not hemodynamically significant by DFR or FFR.  There is no significant coronary artery disease in the LCx or RCA. 2. Normal left ventricular filling pressure.  Recommendations: 1. Medical therapy; I will add isosorbide mononitrate 15 mg daily, to be uptitrated as tolerated. 2. Risk factor modification to prevent progression of coronary artery disease.   TTE IMPRESSIONS  08/16/2019 1. Left ventricular ejection fraction, by estimation, is 60 to 65%. The  left ventricle has normal function. The left ventricle has no regional  wall motion abnormalities. Left ventricular diastolic parameters are  consistent with Grade  II diastolic  dysfunction (pseudonormalization). Elevated left atrial pressure.  2. Right ventricular systolic function is normal. The right ventricular  size is normal.  3. The mitral valve is normal in structure and function. No evidence of  mitral valve regurgitation. No evidence of mitral stenosis.  4. The aortic valve is normal in structure and function. Aortic valve  regurgitation is not visualized. No aortic stenosis is present.  5. The inferior vena cava is normal in size with greater than 50%  respiratory variability, suggesting right atrial pressure of 3 mmHg.    Recent Labs: 08/16/2019: BUN 23; Creatinine, Ser 1.09; Hemoglobin 13.4; Platelets 298; Potassium 4.3; Sodium 142  Recent Lipid Panel No results found for: CHOL, TRIG, HDL, CHOLHDL, VLDL, LDLCALC, LDLDIRECT  Physical Exam:    VS:  BP 120/86 (BP Location: Left Arm,  Patient Position: Sitting, Cuff Size: Normal)   Pulse 69   Ht 5\' 5"  (1.651 m)   Wt 195 lb (88.5 kg)   SpO2 95%   BMI 32.45 kg/m     Wt Readings from Last 3 Encounters:  09/13/19 195 lb (88.5 kg)  08/20/19 189 lb (85.7 kg)  08/12/19 189 lb (85.7 kg)     GEN: Well nourished, well developed in no acute distress HEENT: Normal NECK: No JVD; No carotid bruits LYMPHATICS: No lymphadenopathy CARDIAC: S1S2 noted,RRR, no murmurs, rubs, gallops RESPIRATORY:  Clear to auscultation without rales, wheezing or rhonchi  ABDOMEN: Soft, non-tender, non-distended, +bowel sounds, no guarding. EXTREMITIES: No edema, No cyanosis, no clubbing MUSCULOSKELETAL:  No deformity  SKIN: Warm and dry NEUROLOGIC:  Alert and oriented x 3, non-focal PSYCHIATRIC:  Normal affect, good insight  ASSESSMENT:    1. Coronary artery disease of native artery of native heart with stable angina pectoris (HCC)   2. Essential hypertension   3. PAT (paroxysmal atrial tachycardia) (HCC)   4. Mixed hyperlipidemia   5. Obesity (BMI 30-39.9)    PLAN:     Her blood pressure is acceptable in the office today however the patient is experiencing significant terrible leg swelling on the amlodipine 10 mg daily and wishes to get off this medication.  I do not think is unreasonable therefore I am going to stop the amlodipine and start the patient on Toprol-XL 12.5 mg daily I would really prefer to have put her on Coreg for better blood pressure control but she tells me that she will not be able to take twice a day medications.  So for now we will monitor her on Toprol 12.5 mg she has been asked to take her blood pressure daily should she see readings constantly above 130/80 to call the office at which time if needed I will add hydrochlorothiazide 12.5 mg daily.  HLN - continue her Repatha.   PAT - she is having some palpitations, I am hoping that the beta blocker gives some symptomatic relief.    Obesity -the patient understands  the need to lose weight with diet and exercise. We have discussed specific strategies for this.   The patient is in agreement with the above plan. The patient left the office in stable condition.  The patient will follow up in 6 months.   Medication Adjustments/Labs and Tests Ordered: Current medicines are reviewed at length with the patient today.  Concerns regarding medicines are outlined above.  No orders of the defined types were placed in this encounter.  Meds ordered this encounter  Medications  . metoprolol succinate (TOPROL XL) 25 MG 24 hr tablet  Sig: Take 0.5 tablets (12.5 mg total) by mouth daily.    Dispense:  45 tablet    Refill:  3    Patient Instructions  Medication Instructions:  Your physician has recommended you make the following change in your medication:  1-START Metoprolol 12.5 mg by mouth daily 2-STOP amlodipine   *If you need a refill on your cardiac medications before your next appointment, please call your pharmacy*  Lab Work: If you have labs (blood work) drawn today and your tests are completely normal, you will receive your results only by: Marland Kitchen MyChart Message (if you have MyChart) OR . A paper copy in the mail If you have any lab test that is abnormal or we need to change your treatment, we will call you to review the results.  Testing/Procedures: None ordered today.  Follow-Up: At [facility], you and your health needs are our priority.  As part of our continuing mission to provide you with exceptional heart care, we have created designated Provider Care Teams.  These Care Teams include your primary Cardiologist (physician) and Advanced Practice Providers (APPs -  Physician Assistants and Nurse Practitioners) who all work together to provide you with the care you need, when you need it.  We recommend signing up for the patient portal called "MyChart".  Sign up information is provided on this After Visit Summary.  MyChart is used to connect with  patients for Virtual Visits (Telemedicine).  Patients are able to view lab/test results, encounter notes, upcoming appointments, etc.  Non-urgent messages can be sent to your provider as well.   To learn more about what you can do with MyChart, go to [email or web address].    Your next appointment:   6 month(s)  The format for your next appointment:   In Person  Provider:   You will see Cyncere Ruhe, DO.      Adopting a Healthy Lifestyle.  Know what a healthy weight is for you (roughly BMI <25) and aim to maintain this   Aim for 7+ servings of fruits and vegetables daily   65-80+ fluid ounces of water or unsweet tea for healthy kidneys   Limit to max 1 drink of alcohol per day; avoid smoking/tobacco   Limit animal fats in diet for cholesterol and heart health - choose grass fed whenever available   Avoid highly processed foods, and foods high in saturated/trans fats   Aim for low stress - take time to unwind and care for your mental health   Aim for 150 min of moderate intensity exercise weekly for heart health, and weights twice weekly for bone health   Aim for 7-9 hours of sleep daily   When it comes to diets, agreement about the perfect plan isnt easy to find, even among the experts. Experts at the [facility] [employer] developed an idea known as the Healthy Eating Plate. Just imagine a plate divided into logical, healthy portions.   The emphasis is on diet quality:   Load up on vegetables and fruits - one-half of your plate: Aim for color and variety, and remember that potatoes dont count.   Go for whole grains - one-quarter of your plate: Whole wheat, barley, wheat berries, quinoa, oats, brown rice, and foods made with them. If you want pasta, go with whole wheat pasta.   Protein power - one-quarter of your plate: Fish, chicken, beans, and nuts are all healthy, versatile protein sources. Limit red meat.   The diet, however, does go beyond  the plate,  offering a few other suggestions.   Use healthy plant oils, such as olive, canola, soy, corn, sunflower and peanut. Check the labels, and avoid partially hydrogenated oil, which have unhealthy trans fats.   If youre thirsty, drink water. Coffee and tea are good in moderation, but skip sugary drinks and limit milk and dairy products to one or two daily servings.   The type of carbohydrate in the diet is more important than the amount. Some sources of carbohydrates, such as vegetables, fruits, whole grains, and beans-are healthier than others.   Finally, stay active  Signed, Thomasene Ripple, DO  09/13/2019 2:49 PM    [facility]

## 2019-09-13 NOTE — Patient Instructions (Addendum)
Medication Instructions:  Your physician has recommended you make the following change in your medication:  1-START Metoprolol 12.5 mg by mouth daily 2-STOP amlodipine   *If you need a refill on your cardiac medications before your next appointment, please call your pharmacy*  Lab Work: If you have labs (blood work) drawn today and your tests are completely normal, you will receive your results only by: Marland Kitchen MyChart Message (if you have MyChart) OR . A paper copy in the mail If you have any lab test that is abnormal or we need to change your treatment, we will call you to review the results.  Testing/Procedures: None ordered today.  Follow-Up: At [facility], you and your health needs are our priority.  As part of our continuing mission to provide you with exceptional heart care, we have created designated Provider Care Teams.  These Care Teams include your primary Cardiologist (physician) and Advanced Practice Providers (APPs -  Physician Assistants and Nurse Practitioners) who all work together to provide you with the care you need, when you need it.  We recommend signing up for the patient portal called "MyChart".  Sign up information is provided on this After Visit Summary.  MyChart is used to connect with patients for Virtual Visits (Telemedicine).  Patients are able to view lab/test results, encounter notes, upcoming appointments, etc.  Non-urgent messages can be sent to your provider as well.   To learn more about what you can do with MyChart, go to [email or web address].    Your next appointment:   6 month(s)  The format for your next appointment:   In Person  Provider:   You will see Kardie Tobb, DO.

## 2019-09-22 ENCOUNTER — Other Ambulatory Visit: Payer: Self-pay | Admitting: Family Medicine

## 2019-09-22 ENCOUNTER — Other Ambulatory Visit: Payer: Self-pay

## 2019-09-22 NOTE — Telephone Encounter (Signed)
Refill request sent from [facility] for Amlodipine 10 mg.  Denied refill because Dr. Servando Salina instructed the patient to discontinue this medication.

## 2019-09-29 ENCOUNTER — Ambulatory Visit: Payer: No Typology Code available for payment source | Admitting: Cardiology

## 2020-02-22 ENCOUNTER — Ambulatory Visit: Payer: 59 | Admitting: Cardiology

## 2020-03-13 ENCOUNTER — Other Ambulatory Visit: Payer: Self-pay

## 2020-03-13 MED ORDER — ISOSORBIDE MONONITRATE ER 30 MG PO TB24: 30.0000 mg | ORAL_TABLET | Freq: Every day | ORAL | 1 refills | Status: DC

## 2020-03-13 NOTE — Telephone Encounter (Signed)
Rx sent to [facility] for Isosorbide 30 mg.

## 2020-03-17 ENCOUNTER — Encounter: Payer: Self-pay | Admitting: Cardiology

## 2020-03-17 ENCOUNTER — Ambulatory Visit: Payer: 59 | Admitting: Cardiology

## 2020-03-17 ENCOUNTER — Other Ambulatory Visit: Payer: Self-pay

## 2020-03-17 VITALS — BP 120/78 | HR 52 | Ht 65.0 in | Wt 190.4 lb

## 2020-03-17 DIAGNOSIS — E782 Mixed hyperlipidemia: Secondary | ICD-10-CM | POA: Diagnosis not present

## 2020-03-17 DIAGNOSIS — I471 Supraventricular tachycardia: Secondary | ICD-10-CM

## 2020-03-17 DIAGNOSIS — I10 Essential (primary) hypertension: Secondary | ICD-10-CM | POA: Diagnosis not present

## 2020-03-17 DIAGNOSIS — E669 Obesity, unspecified: Secondary | ICD-10-CM

## 2020-03-17 DIAGNOSIS — I25118 Atherosclerotic heart disease of native coronary artery with other forms of angina pectoris: Secondary | ICD-10-CM

## 2020-03-17 DIAGNOSIS — I4719 Other supraventricular tachycardia: Secondary | ICD-10-CM

## 2020-03-17 MED ORDER — METOPROLOL SUCCINATE ER 25 MG PO TB24: 12.5000 mg | ORAL_TABLET | Freq: Two times a day (BID) | ORAL | 3 refills | Status: DC

## 2020-03-17 NOTE — Progress Notes (Signed)
Cardiology Office Note:    Date:  03/17/2020   ID:  Shirley Barber, DOB 01/26/1966, MRN [id number]  PCP:  [facility]ines[facility], [facility]  Cardiologist:  Thomasene RippleKardie Reeves Musick, DO  Electrophysiologist:  None   Referring MD: Pc, [facility]*   Follow up visit  History of Present Illness:    Shirley Barber is a [age] y.o. female with a hx of moderate coronary artery disease left heart catheterization in February 2021, hypertension, hyperlipidemia, paroxysmal atrial tachycardia and rare premature atrial complexes presents today for follow-up visit.  I last saw the patient in March 2021 at that time she was status post left heart catheterization with no indication for PCI.  She tells me that she has been doing well from a cardiovascular standpoint.  She does have increasing intermittent palpitations.  No chest pressure.  She did stop her aspirin as this has been causing significant GI upset.  Past Medical History:  Diagnosis Date  . Abnormal FFR CT 08/12/2019  . Chest pain 06/24/2019  . Coronary artery disease of native artery of native heart with stable angina pectoris (HCC) 08/12/2019  . HTN (hypertension)   . Hyperlipidemia   . Hypertension 06/24/2019  . Palpitations 06/24/2019  . PAT (paroxysmal atrial tachycardia) (HCC) 08/12/2019  . Pre-procedure lab exam 08/12/2019  . Shortness of breath 06/24/2019    Past Surgical History:  Procedure Laterality Date  . CESAREAN SECTION    . CHOLECYSTECTOMY    . HYSTERECTOMY ABDOMINAL WITH SALPINGECTOMY    . INTRAVASCULAR PRESSURE WIRE/FFR STUDY N/A 08/20/2019   Procedure: INTRAVASCULAR PRESSURE WIRE/FFR STUDY;  Surgeon: Yvonne KendallEnd, Christopher, MD;  Location: [facility] INVASIVE CV LAB;  Service: Cardiovascular;  Laterality: N/A;  . LEFT HEART CATH AND CORONARY ANGIOGRAPHY N/A 08/20/2019   Procedure: LEFT HEART CATH AND CORONARY ANGIOGRAPHY;  Surgeon: Yvonne KendallEnd, Christopher, MD;  Location: [facility] INVASIVE CV LAB;  Service: Cardiovascular;  Laterality: N/A;  . SPINAL  FUSION      Current Medications: Current Meds  Medication Sig  . cyclobenzaprine (FLEXERIL) 10 MG tablet Take 10 mg by mouth daily as needed for muscle spasms.   . isosorbide mononitrate (IMDUR) 30 MG 24 hr tablet Take 1 tablet (30 mg total) by mouth daily.  . meloxicam (MOBIC) 7.5 MG tablet Take 7.5 mg by mouth daily as needed for pain.   . metoprolol succinate (TOPROL XL) 25 MG 24 hr tablet Take 0.5 tablets (12.5 mg total) by mouth daily.  . nitroGLYCERIN (NITROSTAT) 0.4 MG SL tablet Place 1 tablet (0.4 mg total) under the tongue every 5 (five) minutes as needed. (Patient taking differently: Place 0.4 mg under the tongue every 5 (five) minutes as needed for chest pain. )  . REPATHA SURECLICK 140 MG/ML SOAJ Inject 1 mL into the skin every 14 (fourteen) days.  . valACYclovir (VALTREX) 500 MG tablet Take 500 mg by mouth as needed (outbreak).   . Vitamin D, Ergocalciferol, (DRISDOL) 1.25 MG (50000 UNIT) CAPS capsule Take 50,000 Units by mouth once a week. Monday     Allergies:   Morphine, Nitrofurantoin, Other, and Statins   Social History   Socioeconomic History  . Marital status: Married    Spouse name: Not on file  . Number of children: Not on file  . Years of education: Not on file  . Highest education level: Not on file  Occupational History  . Not on file  Tobacco Use  . Smoking status: Never Smoker  . Smokeless tobacco: Never Used  Vaping Use  .  Vaping Use: Never used  Substance and Sexual Activity  . Alcohol use: Not Currently    Comment: occasionally  . Drug use: Never  . Sexual activity: Not on file  Other Topics Concern  . Not on file  Social History Narrative  . Not on file   Social Determinants of Health   Financial Resource Strain:   . Difficulty of Paying Living Expenses: Not on file  Food Insecurity:   . Worried About Programme researcher, broadcasting/film/video in the Last Year: Not on file  . Ran Out of Food in the Last Year: Not on file  Transportation Needs:   . Lack of  Transportation (Medical): Not on file  . Lack of Transportation (Non-Medical): Not on file  Physical Activity:   . Days of Exercise per Week: Not on file  . Minutes of Exercise per Session: Not on file  Stress:   . Feeling of Stress : Not on file  Social Connections:   . Frequency of Communication with Friends and Family: Not on file  . Frequency of Social Gatherings with Friends and Family: Not on file  . Attends Religious Services: Not on file  . Active Member of Clubs or Organizations: Not on file  . Attends Banker Meetings: Not on file  . Marital Status: Not on file     Family History: The patient's family history includes Breast cancer in her mother; CAD in her father; Congestive Heart Failure in her father; Diabetes in her brother and father; Healthy in her brother; Heart attack in her brother and sister; Hypertension in her father; Renal Disease in her father.  ROS:   Review of Systems  Constitution: Negative for decreased appetite, fever and weight gain.  HENT: Negative for congestion, ear discharge, hoarse voice and sore throat.   Eyes: Negative for discharge, redness, vision loss in right eye and visual halos.  Cardiovascular: Negative for chest pain, dyspnea on exertion, leg swelling, orthopnea and palpitations.  Respiratory: Negative for cough, hemoptysis, shortness of breath and snoring.   Endocrine: Negative for heat intolerance and polyphagia.  Hematologic/Lymphatic: Negative for bleeding problem. Does not bruise/bleed easily.  Skin: Negative for flushing, nail changes, rash and suspicious lesions.  Musculoskeletal: Negative for arthritis, joint pain, muscle cramps, myalgias, neck pain and stiffness.  Gastrointestinal: Negative for abdominal pain, bowel incontinence, diarrhea and excessive appetite.  Genitourinary: Negative for decreased libido, genital sores and incomplete emptying.  Neurological: Negative for brief paralysis, focal weakness, headaches  and loss of balance.  Psychiatric/Behavioral: Negative for altered mental status, depression and suicidal ideas.  Allergic/Immunologic: Negative for HIV exposure and persistent infections.    EKGs/Labs/Other Studies Reviewed:    The following studies were reviewed today:   EKG: None today  Recent Labs: 08/16/2019: BUN 23; Creatinine, Ser 1.09; Hemoglobin 13.4; Platelets 298; Potassium 4.3; Sodium 142  Recent Lipid Panel No results found for: CHOL, TRIG, HDL, CHOLHDL, VLDL, LDLCALC, LDLDIRECT  Physical Exam:    VS:  BP 120/78   Pulse (!) 52   Ht 5\' 5"  (1.651 m)   Wt 190 lb 6.4 oz (86.4 kg)   SpO2 95%   BMI 31.68 kg/m     Wt Readings from Last 3 Encounters:  03/17/20 190 lb 6.4 oz (86.4 kg)  09/13/19 195 lb (88.5 kg)  08/20/19 189 lb (85.7 kg)     GEN: Well nourished, well developed in no acute distress HEENT: Normal NECK: No JVD; No carotid bruits LYMPHATICS: No lymphadenopathy CARDIAC: S1S2 noted,RRR, no  murmurs, rubs, gallops RESPIRATORY:  Clear to auscultation without rales, wheezing or rhonchi  ABDOMEN: Soft, non-tender, non-distended, +bowel sounds, no guarding. EXTREMITIES: No edema, No cyanosis, no clubbing MUSCULOSKELETAL:  No deformity  SKIN: Warm and dry NEUROLOGIC:  Alert and oriented x 3, non-focal PSYCHIATRIC:  Normal affect, good insight  ASSESSMENT:    1. Essential hypertension   2. Coronary artery disease of native artery of native heart with stable angina pectoris (HCC)   3. PAT (paroxysmal atrial tachycardia) (HCC)   4. Mixed hyperlipidemia   5. Obesity (BMI 30-39.9)    PLAN:     I am going to increase her metoprolol succinate to 12.5 mg twice a day.  Hopefully this will help with her palpitation symptoms.  In addition due to significant GI upset the patient has stopped her aspirin and will start try her with vazalore (coated aspirin) hopefully this will be able to help.  We will continue on her Repatha.  I have asked the patient to  follow-up with me with a MyChart message if she has any improvement from her increase metoprolol as well as her experience on the vazalore.  The patient understands the need to lose weight with diet and exercise. We have discussed specific strategies for this.  The patient is in agreement with the above plan. The patient left the office in stable condition.  The patient will follow up in   Medication Adjustments/Labs and Tests Ordered: Current medicines are reviewed at length with the patient today.  Concerns regarding medicines are outlined above.  No orders of the defined types were placed in this encounter.  No orders of the defined types were placed in this encounter.   Patient Instructions  Medication Instructions:  Increase Metoprolol to 12.5 mg twice a day   *If you need a refill on your cardiac medications before your next appointment, please call your pharmacy*   Lab Work: None ordered   If you have labs (blood work) drawn today and your tests are completely normal, you will receive your results only by: Marland Kitchen MyChart Message (if you have MyChart) OR . A paper copy in the mail If you have any lab test that is abnormal or we need to change your treatment, we will call you to review the results.   Testing/Procedures: None ordered    Follow-Up: At [facility], you and your health needs are our priority.  As part of our continuing mission to provide you with exceptional heart care, we have created designated Provider Care Teams.  These Care Teams include your primary Cardiologist (physician) and Advanced Practice Providers (APPs -  Physician Assistants and Nurse Practitioners) who all work together to provide you with the care you need, when you need it.  We recommend signing up for the patient portal called "MyChart".  Sign up information is provided on this After Visit Summary.  MyChart is used to connect with patients for Virtual Visits (Telemedicine).  Patients are able  to view lab/test results, encounter notes, upcoming appointments, etc.  Non-urgent messages can be sent to your provider as well.   To learn more about what you can do with MyChart, go to [email or web address].    Your next appointment:   6 month(s)  The format for your next appointment:   In Person  Provider:   Thomasene Ripple, DO   Other Instructions None      Adopting a Healthy Lifestyle.  Know what a healthy weight is for you (roughly BMI <25) and aim  to maintain this   Aim for 7+ servings of fruits and vegetables daily   65-80+ fluid ounces of water or unsweet tea for healthy kidneys   Limit to max 1 drink of alcohol per day; avoid smoking/tobacco   Limit animal fats in diet for cholesterol and heart health - choose grass fed whenever available   Avoid highly processed foods, and foods high in saturated/trans fats   Aim for low stress - take time to unwind and care for your mental health   Aim for 150 min of moderate intensity exercise weekly for heart health, and weights twice weekly for bone health   Aim for 7-9 hours of sleep daily   When it comes to diets, agreement about the perfect plan isnt easy to find, even among the experts. Experts at the [facility] of [employer] developed an idea known as the Healthy Eating Plate. Just imagine a plate divided into logical, healthy portions.   The emphasis is on diet quality:   Load up on vegetables and fruits - one-half of your plate: Aim for color and variety, and remember that potatoes dont count.   Go for whole grains - one-quarter of your plate: Whole wheat, barley, wheat berries, quinoa, oats, brown rice, and foods made with them. If you want pasta, go with whole wheat pasta.   Protein power - one-quarter of your plate: Fish, chicken, beans, and nuts are all healthy, versatile protein sources. Limit red meat.   The diet, however, does go beyond the plate, offering a few other suggestions.   Use healthy  plant oils, such as olive, canola, soy, corn, sunflower and peanut. Check the labels, and avoid partially hydrogenated oil, which have unhealthy trans fats.   If youre thirsty, drink water. Coffee and tea are good in moderation, but skip sugary drinks and limit milk and dairy products to one or two daily servings.   The type of carbohydrate in the diet is more important than the amount. Some sources of carbohydrates, such as vegetables, fruits, whole grains, and beans-are healthier than others.   Finally, stay active  Signed, Thomasene Ripple, DO  03/17/2020 4:17 PM    [facility]

## 2020-03-17 NOTE — Patient Instructions (Signed)
Medication Instructions:  Increase Metoprolol to 12.5 mg twice a day   *If you need a refill on your cardiac medications before your next appointment, please call your pharmacy*   Lab Work: None ordered   If you have labs (blood work) drawn today and your tests are completely normal, you will receive your results only by: Marland Kitchen MyChart Message (if you have MyChart) OR . A paper copy in the mail If you have any lab test that is abnormal or we need to change your treatment, we will call you to review the results.   Testing/Procedures: None ordered    Follow-Up: At [facility], you and your health needs are our priority.  As part of our continuing mission to provide you with exceptional heart care, we have created designated Provider Care Teams.  These Care Teams include your primary Cardiologist (physician) and Advanced Practice Providers (APPs -  Physician Assistants and Nurse Practitioners) who all work together to provide you with the care you need, when you need it.  We recommend signing up for the patient portal called "MyChart".  Sign up information is provided on this After Visit Summary.  MyChart is used to connect with patients for Virtual Visits (Telemedicine).  Patients are able to view lab/test results, encounter notes, upcoming appointments, etc.  Non-urgent messages can be sent to your provider as well.   To learn more about what you can do with MyChart, go to [email or web address].    Your next appointment:   6 month(s)  The format for your next appointment:   In Person  Provider:   Thomasene Ripple, DO   Other Instructions None

## 2020-08-18 ENCOUNTER — Ambulatory Visit: Payer: 59 | Admitting: Cardiology

## 2020-09-20 DIAGNOSIS — E785 Hyperlipidemia, unspecified: Secondary | ICD-10-CM | POA: Insufficient documentation

## 2020-09-20 DIAGNOSIS — I10 Essential (primary) hypertension: Secondary | ICD-10-CM | POA: Insufficient documentation

## 2020-09-22 ENCOUNTER — Ambulatory Visit: Payer: 59 | Admitting: Cardiology

## 2020-09-22 ENCOUNTER — Encounter: Payer: Self-pay | Admitting: Cardiology

## 2020-09-22 ENCOUNTER — Other Ambulatory Visit: Payer: Self-pay

## 2020-09-22 VITALS — BP 124/88 | HR 59 | Ht 65.0 in | Wt 190.0 lb

## 2020-09-22 DIAGNOSIS — I471 Supraventricular tachycardia: Secondary | ICD-10-CM

## 2020-09-22 DIAGNOSIS — I4719 Other supraventricular tachycardia: Secondary | ICD-10-CM

## 2020-09-22 DIAGNOSIS — I25118 Atherosclerotic heart disease of native coronary artery with other forms of angina pectoris: Secondary | ICD-10-CM

## 2020-09-22 DIAGNOSIS — I2510 Atherosclerotic heart disease of native coronary artery without angina pectoris: Secondary | ICD-10-CM | POA: Diagnosis not present

## 2020-09-22 DIAGNOSIS — I10 Essential (primary) hypertension: Secondary | ICD-10-CM | POA: Diagnosis not present

## 2020-09-22 NOTE — Progress Notes (Signed)
Cardiology Office Note:    Date:  09/22/2020   ID:  Rilynn, Habel Aug 06, 1965, MRN [id number]  PCP:  Olevia Bowens, FNP  Cardiologist:  Thomasene Ripple, DO  Electrophysiologist:  None   Referring MD: Pc, [facility]*   Still some chest discomfort  History of Present Illness:    Shirley Barber is a 55 y.o. female with a hx of nonobstructive coronary artery disease she is status post coronary CTA with abnormal FFR underwent a left heart catheterization which showed nonobstructive disease, versus major tachycardia, hyperlipidemia and obesity.  Patient is here today for follow-up visit.  Last saw the patient in September 2021 at that time she was having some GI upset with the aspirin will stop the bare aspirin started patient on vazalore (coated aspirin) and continue her Repatha. Since I saw the patient she told me that she had stopped the vasodilator and I also stop her Imdur.  Still experiences chest comfort.  She states she saw GI and she is planning endoscopy to make sure that she does not have any esophageal strictures.  Past Medical History:  Diagnosis Date  . Abnormal FFR CT 08/12/2019  . Chest pain 06/24/2019  . Coronary artery disease of native artery of native heart with stable angina pectoris (HCC) 08/12/2019  . HTN (hypertension)   . Hyperlipidemia   . Hypertension 06/24/2019  . Palpitations 06/24/2019  . PAT (paroxysmal atrial tachycardia) (HCC) 08/12/2019  . Pre-procedure lab exam 08/12/2019  . Shortness of breath 06/24/2019    Past Surgical History:  Procedure Laterality Date  . CESAREAN SECTION    . CHOLECYSTECTOMY    . HYSTERECTOMY ABDOMINAL WITH SALPINGECTOMY    . INTRAVASCULAR PRESSURE WIRE/FFR STUDY N/A 08/20/2019   Procedure: INTRAVASCULAR PRESSURE WIRE/FFR STUDY;  Surgeon: Yvonne Kendall, MD;  Location: [facility];  Service: Cardiovascular;  Laterality: N/A;  . LEFT HEART CATH AND CORONARY ANGIOGRAPHY N/A 08/20/2019   Procedure: LEFT HEART CATH  AND CORONARY ANGIOGRAPHY;  Surgeon: Yvonne Kendall, MD;  Location: [facility];  Service: Cardiovascular;  Laterality: N/A;  . SPINAL FUSION      Current Medications: Current Meds  Medication Sig  . cyclobenzaprine (FLEXERIL) 10 MG tablet Take 10 mg by mouth daily as needed for muscle spasms.   . isosorbide mononitrate (IMDUR) 30 MG 24 hr tablet Take 1 tablet (30 mg total) by mouth daily.  . meloxicam (MOBIC) 7.5 MG tablet Take 7.5 mg by mouth daily as needed for pain.   . metoprolol succinate (TOPROL XL) 25 MG 24 hr tablet Take 0.5 tablets (12.5 mg total) by mouth in the morning and at bedtime. (Patient taking differently: Take 12.5 mg by mouth daily.)  . omeprazole (PRILOSEC) 20 MG capsule Take 20 mg by mouth 2 (two) times daily as needed (GERD, HEART BURN).  Marland Kitchen REPATHA SURECLICK 140 MG/ML SOAJ Inject 1 mL into the skin every 14 (fourteen) days.  . valACYclovir (VALTREX) 500 MG tablet Take 500 mg by mouth as needed (outbreak).      Allergies:   Morphine, Nitrofurantoin, Other, and Statins   Social History   Socioeconomic History  . Marital status: Married    Spouse name: Not on file  . Number of children: Not on file  . Years of education: Not on file  . Highest education level: Not on file  Occupational History  . Not on file  Tobacco Use  . Smoking status: Never Smoker  . Smokeless tobacco: Never Used  Vaping Use  . Vaping Use: Never used  Substance and Sexual Activity  . Alcohol use: Not Currently    Comment: occasionally  . Drug use: Never  . Sexual activity: Not on file  Other Topics Concern  . Not on file  Social History Narrative  . Not on file   Social Determinants of Health   Financial Resource Strain: Not on file  Food Insecurity: Not on file  Transportation Needs: Not on file  Physical Activity: Not on file  Stress: Not on file  Social Connections: Not on file     Family History: The patient's family history includes Breast cancer in her  mother; CAD in her father; Congestive Heart Failure in her father; Diabetes in her brother and father; Healthy in her brother; Heart attack in her brother and sister; Hypertension in her father; Renal Disease in her father.  ROS:   Review of Systems  Constitution: Negative for decreased appetite, fever and weight gain.  HENT: Negative for congestion, ear discharge, hoarse voice and sore throat.   Eyes: Negative for discharge, redness, vision loss in right eye and visual halos.  Cardiovascular: Negative for chest pain, dyspnea on exertion, leg swelling, orthopnea and palpitations.  Respiratory: Negative for cough, hemoptysis, shortness of breath and snoring.   Endocrine: Negative for heat intolerance and polyphagia.  Hematologic/Lymphatic: Negative for bleeding problem. Does not bruise/bleed easily.  Skin: Negative for flushing, nail changes, rash and suspicious lesions.  Musculoskeletal: Negative for arthritis, joint pain, muscle cramps, myalgias, neck pain and stiffness.  Gastrointestinal: Negative for abdominal pain, bowel incontinence, diarrhea and excessive appetite.  Genitourinary: Negative for decreased libido, genital sores and incomplete emptying.  Neurological: Negative for brief paralysis, focal weakness, headaches and loss of balance.  Psychiatric/Behavioral: Negative for altered mental status, depression and suicidal ideas.  Allergic/Immunologic: Negative for HIV exposure and persistent infections.    EKGs/Labs/Other Studies Reviewed:    The following studies were reviewed today:   EKG:  The ekg ordered today demonstrates   Left heart catheterization Conclusions: 1. Moderate single vessel coronary artery disease with sequential 25%, 50%, and 60% proximal and mid LAD stenoses.  The disease is not hemodynamically significant by DFR or FFR.  There is no significant coronary artery disease in the LCx or RCA. 2. Normal left ventricular filling  pressure.  Recommendations: 1. Medical therapy; I will add isosorbide mononitrate 15 mg daily, to be uptitrated as tolerated. 2. Risk factor modification to prevent progression of coronary artery disease.   Recent Labs: No results found for requested labs within last 8760 hours.  Recent Lipid Panel No results found for: CHOL, TRIG, HDL, CHOLHDL, VLDL, LDLCALC, LDLDIRECT  Physical Exam:    VS:  BP 124/88   Pulse (!) 59   Ht 5\' 5"  (1.651 m)   Wt 190 lb (86.2 kg)   SpO2 97%   BMI 31.62 kg/m     Wt Readings from Last 3 Encounters:  09/22/20 190 lb (86.2 kg)  03/17/20 190 lb 6.4 oz (86.4 kg)  09/13/19 195 lb (88.5 kg)     GEN: Well nourished, well developed in no acute distress HEENT: Normal NECK: No JVD; No carotid bruits LYMPHATICS: No lymphadenopathy CARDIAC: S1S2 noted,RRR, no murmurs, rubs, gallops RESPIRATORY:  Clear to auscultation without rales, wheezing or rhonchi  ABDOMEN: Soft, non-tender, non-distended, +bowel sounds, no guarding. EXTREMITIES: No edema, No cyanosis, no clubbing MUSCULOSKELETAL:  No deformity  SKIN: Warm and dry NEUROLOGIC:  Alert and oriented x 3, non-focal PSYCHIATRIC:  Normal affect, good insight  ASSESSMENT:    1. Coronary artery disease involving native coronary artery of native heart, unspecified whether angina present   2. Coronary artery disease of native artery of native heart with stable angina pectoris (HCC)   3. Hypertension, unspecified type   4. PAT (paroxysmal atrial tachycardia) (HCC)    PLAN:    She still is having some discomfort but she is suspicious that this may be mostly GI and has stopped the Imdur.  I discussed with the patient in length we again went over all of her previous testings.  For now she will stay off the Imdur.  She has her endoscopy coming up in May therefore we plan to see in a few months after that to see if symptoms improve.  If not she would be agreeable to start her on antianginals.  She is off  antiplatelets with aspirin.  For now discussion again will be after her GI work-up.  She is still on her Repatha she has no problem with this. She is continue her Toprol-XL for her paroxysmal atrial tachycardia.  The patient is in agreement with the above plan. The patient left the office in stable condition.  The patient will follow up in 5 months or sooner if needed.   Medication Adjustments/Labs and Tests Ordered: Current medicines are reviewed at length with the patient today.  Concerns regarding medicines are outlined above.  Orders Placed This Encounter  Procedures  . EKG 12-Lead   No orders of the defined types were placed in this encounter.   Patient Instructions  Medication Instructions:  Your physician recommends that you continue on your current medications as directed. Please refer to the Current Medication list given to you today.  *If you need a refill on your cardiac medications before your next appointment, please call your pharmacy*   Lab Work: None If you have labs (blood work) drawn today and your tests are completely normal, you will receive your results only by: Marland Kitchen. MyChart Message (if you have MyChart) OR . A paper copy in the mail If you have any lab test that is abnormal or we need to change your treatment, we will call you to review the results.   Testing/Procedures: None   Follow-Up: At [facility], you and your health needs are our priority.  As part of our continuing mission to provide you with exceptional heart care, we have created designated Provider Care Teams.  These Care Teams include your primary Cardiologist (physician) and Advanced Practice Providers (APPs -  Physician Assistants and Nurse Practitioners) who all work together to provide you with the care you need, when you need it.  We recommend signing up for the patient portal called "MyChart".  Sign up information is provided on this After Visit Summary.  MyChart is used to connect with  patients for Virtual Visits (Telemedicine).  Patients are able to view lab/test results, encounter notes, upcoming appointments, etc.  Non-urgent messages can be sent to your provider as well.   To learn more about what you can do with MyChart, go to [email or web address].    Your next appointment:   5 month(s)  The format for your next appointment:   In Person  Provider:   Thomasene RippleKardie Alfredia Desanctis, DO   Other Instructions      Adopting a Healthy Lifestyle.  Know what a healthy weight is for you (roughly BMI <25) and aim to maintain this   Aim for 7+ servings of fruits and vegetables daily  65-80+ fluid ounces of water or unsweet tea for healthy kidneys   Limit to max 1 drink of alcohol per day; avoid smoking/tobacco   Limit animal fats in diet for cholesterol and heart health - choose grass fed whenever available   Avoid highly processed foods, and foods high in saturated/trans fats   Aim for low stress - take time to unwind and care for your mental health   Aim for 150 min of moderate intensity exercise weekly for heart health, and weights twice weekly for bone health   Aim for 7-9 hours of sleep daily   When it comes to diets, agreement about the perfect plan isnt easy to find, even among the experts. Experts at the [facility] [employer] developed an idea known as the Healthy Eating Plate. Just imagine a plate divided into logical, healthy portions.   The emphasis is on diet quality:   Load up on vegetables and fruits - one-half of your plate: Aim for color and variety, and remember that potatoes dont count.   Go for whole grains - one-quarter of your plate: Whole wheat, barley, wheat berries, quinoa, oats, brown rice, and foods made with them. If you want pasta, go with whole wheat pasta.   Protein power - one-quarter of your plate: Fish, chicken, beans, and nuts are all healthy, versatile protein sources. Limit red meat.   The diet, however, does go beyond the  plate, offering a few other suggestions.   Use healthy plant oils, such as olive, canola, soy, corn, sunflower and peanut. Check the labels, and avoid partially hydrogenated oil, which have unhealthy trans fats.   If youre thirsty, drink water. Coffee and tea are good in moderation, but skip sugary drinks and limit milk and dairy products to one or two daily servings.   The type of carbohydrate in the diet is more important than the amount. Some sources of carbohydrates, such as vegetables, fruits, whole grains, and beans-are healthier than others.   Finally, stay active  Signed, Thomasene Ripple, DO  09/22/2020 4:33 PM    [facility]

## 2020-09-22 NOTE — Patient Instructions (Signed)
Medication Instructions:  Your physician recommends that you continue on your current medications as directed. Please refer to the Current Medication list given to you today.  *If you need a refill on your cardiac medications before your next appointment, please call your pharmacy*   Lab Work: None If you have labs (blood work) drawn today and your tests are completely normal, you will receive your results only by: MyChart Message (if you have MyChart) OR A paper copy in the mail If you have any lab test that is abnormal or we need to change your treatment, we will call you to review the results.   Testing/Procedures: None   Follow-Up: At [facility], you and your health needs are our priority.  As part of our continuing mission to provide you with exceptional heart care, we have created designated Provider Care Teams.  These Care Teams include your primary Cardiologist (physician) and Advanced Practice Providers (APPs -  Physician Assistants and Nurse Practitioners) who all work together to provide you with the care you need, when you need it.  We recommend signing up for the patient portal called "MyChart".  Sign up information is provided on this After Visit Summary.  MyChart is used to connect with patients for Virtual Visits (Telemedicine).  Patients are able to view lab/test results, encounter notes, upcoming appointments, etc.  Non-urgent messages can be sent to your provider as well.   To learn more about what you can do with MyChart, go to [email or web address].    Your next appointment:   5 month(s)  The format for your next appointment:   In Person  Provider:   Kardie Tobb, DO   Other Instructions   

## 2020-10-20 ENCOUNTER — Telehealth: Payer: Self-pay | Admitting: Cardiology

## 2020-10-20 MED ORDER — METOPROLOL SUCCINATE ER 25 MG PO TB24: 12.5000 mg | ORAL_TABLET | Freq: Two times a day (BID) | ORAL | 3 refills | Status: DC

## 2020-10-20 NOTE — Telephone Encounter (Signed)
*  STAT* If patient is at the pharmacy, call can be transferred to refill team.   1. Which medications need to be refilled? (please list name of each medication and dose if known) metoprolol succinate (TOPROL XL) 25 MG 24 hr tablet  2. Which pharmacy/location (including street and city if local pharmacy) is medication to be sent to? [facility] - [place], [place] - [address]  3. Do they need a 30 day or 90 day supply? 90 day   Pharmacy states patient is taking half a tablet twice a day and they do not have this prescription.

## 2020-11-28 DIAGNOSIS — Z1231 Encounter for screening mammogram for malignant neoplasm of breast: Secondary | ICD-10-CM | POA: Diagnosis not present

## 2020-11-28 DIAGNOSIS — Z6832 Body mass index (BMI) 32.0-32.9, adult: Secondary | ICD-10-CM | POA: Diagnosis not present

## 2020-11-28 DIAGNOSIS — Z01419 Encounter for gynecological examination (general) (routine) without abnormal findings: Secondary | ICD-10-CM | POA: Diagnosis not present

## 2021-03-07 ENCOUNTER — Other Ambulatory Visit: Payer: Self-pay

## 2021-03-08 ENCOUNTER — Ambulatory Visit: Payer: BC Managed Care – PPO | Admitting: Cardiology

## 2021-03-08 ENCOUNTER — Encounter: Payer: Self-pay | Admitting: Cardiology

## 2021-03-08 ENCOUNTER — Other Ambulatory Visit: Payer: Self-pay

## 2021-03-08 VITALS — BP 136/82 | HR 54 | Ht 65.0 in | Wt 195.4 lb

## 2021-03-08 DIAGNOSIS — I25118 Atherosclerotic heart disease of native coronary artery with other forms of angina pectoris: Secondary | ICD-10-CM | POA: Diagnosis not present

## 2021-03-08 DIAGNOSIS — E782 Mixed hyperlipidemia: Secondary | ICD-10-CM

## 2021-03-08 DIAGNOSIS — I10 Essential (primary) hypertension: Secondary | ICD-10-CM | POA: Diagnosis not present

## 2021-03-08 DIAGNOSIS — I471 Supraventricular tachycardia: Secondary | ICD-10-CM | POA: Diagnosis not present

## 2021-03-08 DIAGNOSIS — E669 Obesity, unspecified: Secondary | ICD-10-CM

## 2021-03-08 MED ORDER — ISOSORBIDE MONONITRATE ER 60 MG PO TB24: 60.0000 mg | ORAL_TABLET | Freq: Every day | ORAL | 3 refills | Status: DC

## 2021-03-08 MED ORDER — CLOPIDOGREL BISULFATE 75 MG PO TABS: 75.0000 mg | ORAL_TABLET | Freq: Every day | ORAL | 3 refills | Status: DC

## 2021-03-08 NOTE — Patient Instructions (Signed)
Medication Instructions:  Your physician has recommended you make the following change in your medication:  START: Plavix 75 mg once daily START: Imdur 60 mg once daily *If you need a refill on your cardiac medications before your next appointment, please call your pharmacy*   Lab Work: None If you have labs (blood work) drawn today and your tests are completely normal, you will receive your results only by: MyChart Message (if you have MyChart) OR A paper copy in the mail If you have any lab test that is abnormal or we need to change your treatment, we will call you to review the results.   Testing/Procedures: None   Follow-Up: At [facility], you and your health needs are our priority.  As part of our continuing mission to provide you with exceptional heart care, we have created designated Provider Care Teams.  These Care Teams include your primary Cardiologist (physician) and Advanced Practice Providers (APPs -  Physician Assistants and Nurse Practitioners) who all work together to provide you with the care you need, when you need it.  We recommend signing up for the patient portal called "MyChart".  Sign up information is provided on this After Visit Summary.  MyChart is used to connect with patients for Virtual Visits (Telemedicine).  Patients are able to view lab/test results, encounter notes, upcoming appointments, etc.  Non-urgent messages can be sent to your provider as well.   To learn more about what you can do with MyChart, go to [email or web address].    Your next appointment:   1 year(s)  The format for your next appointment:   In Person  Provider:   Thomasene Ripple, DO [address], [place], [place] [address]    Other Instructions

## 2021-03-08 NOTE — Progress Notes (Signed)
Cardiology Office Note:    Date:  03/08/2021   ID:  Shirley, Barber 1966-01-24, MRN [id number]  PCP:  Olevia Bowens, FNP  Cardiologist:  Thomasene Ripple, DO  Electrophysiologist:  None   Referring MD: Olevia Bowens, FNP   Chief Complaint  Patient presents with   Medication Management    History of Present Illness:    Shirley Barber is a 55 y.o. female with a hx of nonobstructive coronary artery disease she is status post coronary CTA with abnormal FFR underwent a left heart catheterization which showed nonobstructive disease, versus major tachycardia, hyperlipidemia and obesity.  Patient is here today for follow-up visit.  I saw the patient in September 2021 at that time she was having some GI upset with the aspirin will stop the bare aspirin started patient on vazalore (coated aspirin) and continue her Repatha.  At her visit on September 22, 2020 she has stopped the Imdur.  And she was having some discomfort suspected to be GI was planning endoscopy.  She was also asked to be off the aspirin at that time.  Since I saw the patient she has had intermittent chest discomfort.  She also has not tried the aspirin as she had had GI upset.   Past Medical History:  Diagnosis Date   Abnormal FFR CT 08/12/2019   Chest pain 06/24/2019   Coronary artery disease of native artery of native heart with stable angina pectoris (HCC) 08/12/2019   HTN (hypertension)    Hyperlipidemia    Hypertension 06/24/2019   Palpitations 06/24/2019   PAT (paroxysmal atrial tachycardia) (HCC) 08/12/2019   Pre-procedure lab exam 08/12/2019   Shortness of breath 06/24/2019    Past Surgical History:  Procedure Laterality Date   CESAREAN SECTION     CHOLECYSTECTOMY     HYSTERECTOMY ABDOMINAL WITH SALPINGECTOMY     INTRAVASCULAR PRESSURE WIRE/FFR STUDY N/A 08/20/2019   Procedure: INTRAVASCULAR PRESSURE WIRE/FFR STUDY;  Surgeon: Yvonne Kendall, MD;  Location: [facility];  Service: Cardiovascular;   Laterality: N/A;   LEFT HEART CATH AND CORONARY ANGIOGRAPHY N/A 08/20/2019   Procedure: LEFT HEART CATH AND CORONARY ANGIOGRAPHY;  Surgeon: Yvonne Kendall, MD;  Location: [facility];  Service: Cardiovascular;  Laterality: N/A;   SPINAL FUSION      Current Medications: Current Meds  Medication Sig   clopidogrel (PLAVIX) 75 MG tablet Take 1 tablet (75 mg total) by mouth daily.   isosorbide mononitrate (IMDUR) 60 MG 24 hr tablet Take 1 tablet (60 mg total) by mouth daily.   metoprolol succinate (TOPROL XL) 25 MG 24 hr tablet Take 0.5 tablets (12.5 mg total) by mouth in the morning and at bedtime.   nitroGLYCERIN (NITROSTAT) 0.4 MG SL tablet Place 1 tablet (0.4 mg total) under the tongue every 5 (five) minutes as needed. (Patient taking differently: Place 0.4 mg under the tongue every 5 (five) minutes as needed for chest pain.)   omeprazole (PRILOSEC) 20 MG capsule Take 20 mg by mouth 2 (two) times daily as needed (GERD, HEART BURN).   REPATHA SURECLICK 140 MG/ML SOAJ Inject 1 mL into the skin every 14 (fourteen) days.   valACYclovir (VALTREX) 500 MG tablet Take 500 mg by mouth as needed (outbreak).    [DISCONTINUED] isosorbide mononitrate (IMDUR) 30 MG 24 hr tablet Take 1 tablet (30 mg total) by mouth daily.     Allergies:   Morphine, Nitrofurantoin, Aspirin, Other, and Statins   Social History   Socioeconomic History  Marital status: Married    Spouse name: Not on file   Number of children: Not on file   Years of education: Not on file   Highest education level: Not on file  Occupational History   Not on file  Tobacco Use   Smoking status: Never   Smokeless tobacco: Never  Vaping Use   Vaping Use: Never used  Substance and Sexual Activity   Alcohol use: Not Currently    Comment: occasionally   Drug use: Never   Sexual activity: Not on file  Other Topics Concern   Not on file  Social History Narrative   Not on file   Social Determinants of Health   Financial  Resource Strain: Not on file  Food Insecurity: Not on file  Transportation Needs: Not on file  Physical Activity: Not on file  Stress: Not on file  Social Connections: Not on file     Family History: The patient's family history includes Breast cancer in her mother; CAD in her father; Congestive Heart Failure in her father; Diabetes in her brother and father; Healthy in her brother; Heart attack in her brother and sister; Hypertension in her father; Renal Disease in her father.  ROS:   Review of Systems  Constitution: Negative for decreased appetite, fever and weight gain.  HENT: Negative for congestion, ear discharge, hoarse voice and sore throat.   Eyes: Negative for discharge, redness, vision loss in right eye and visual halos.  Cardiovascular: Negative for chest pain, dyspnea on exertion, leg swelling, orthopnea and palpitations.  Respiratory: Negative for cough, hemoptysis, shortness of breath and snoring.   Endocrine: Negative for heat intolerance and polyphagia.  Hematologic/Lymphatic: Negative for bleeding problem. Does not bruise/bleed easily.  Skin: Negative for flushing, nail changes, rash and suspicious lesions.  Musculoskeletal: Negative for arthritis, joint pain, muscle cramps, myalgias, neck pain and stiffness.  Gastrointestinal: Negative for abdominal pain, bowel incontinence, diarrhea and excessive appetite.  Genitourinary: Negative for decreased libido, genital sores and incomplete emptying.  Neurological: Negative for brief paralysis, focal weakness, headaches and loss of balance.  Psychiatric/Behavioral: Negative for altered mental status, depression and suicidal ideas.  Allergic/Immunologic: Negative for HIV exposure and persistent infections.    EKGs/Labs/Other Studies Reviewed:    The following studies were reviewed today:   EKG: None today  August 16, 2019 IMPRESSIONS     1. Left ventricular ejection fraction, by estimation, is 60 to 65%. The  left  ventricle has normal function. The left ventricle has no regional  wall motion abnormalities. Left ventricular diastolic parameters are  consistent with Grade II diastolic  dysfunction (pseudonormalization). Elevated left atrial pressure.   2. Right ventricular systolic function is normal. The right ventricular  size is normal.   3. The mitral valve is normal in structure and function. No evidence of  mitral valve regurgitation. No evidence of mitral stenosis.   4. The aortic valve is normal in structure and function. Aortic valve  regurgitation is not visualized. No aortic stenosis is present.   5. The inferior vena cava is normal in size with greater than 50%  respiratory variability, suggesting right atrial pressure of 3 mmHg.   FINDINGS   Left Ventricle: Left ventricular ejection fraction, by estimation, is 60  to 65%. The left ventricle has normal function. The left ventricle has no  regional wall motion abnormalities. The left ventricular internal cavity  size was normal in size. There is   no left ventricular hypertrophy. Left ventricular diastolic parameters  are  consistent with Grade II diastolic dysfunction (pseudonormalization).  Elevated left atrial pressure.   Right Ventricle: The right ventricular size is normal. No increase in  right ventricular wall thickness. Right ventricular systolic function is  normal.   Left Atrium: Left atrial size was normal in size.   Right Atrium: Right atrial size was normal in size.   Pericardium: There is no evidence of pericardial effusion.   Mitral Valve: The mitral valve is normal in structure and function. Normal  mobility of the mitral valve leaflets. No evidence of mitral valve  regurgitation. No evidence of mitral valve stenosis.   Tricuspid Valve: The tricuspid valve is normal in structure. Tricuspid  valve regurgitation is not demonstrated. No evidence of tricuspid  stenosis.   Aortic Valve: The aortic valve is normal in  structure and function. Aortic  valve regurgitation is not visualized. No aortic stenosis is present.   Pulmonic Valve: The pulmonic valve was normal in structure. Pulmonic valve  regurgitation is not visualized. No evidence of pulmonic stenosis.   Aorta: The aortic root and ascending aorta are structurally normal, with  no evidence of dilitation.   Venous: A systolic blunting flow pattern is recorded from the right upper  pulmonary vein. The inferior vena cava is normal in size with greater than  50% respiratory variability, suggesting right atrial pressure of 3 mmHg.   IAS/Shunts: No atrial level shunt detected by color flow Doppler  Left heart catheterization August 20, 2019 Conclusions: Moderate single vessel coronary artery disease with sequential 25%, 50%, and 60% proximal and mid LAD stenoses.  The disease is not hemodynamically significant by DFR or FFR.  There is no significant coronary artery disease in the LCx or RCA. Normal left ventricular filling pressure.   Recommendations: Medical therapy; I will add isosorbide mononitrate 15 mg daily, to be uptitrated as tolerated. Risk factor modification to prevent progression of coronary artery disease.  July 15, 2019 The patient wore the monitor for 7 days starting June 24, 2019. Indication: Palpitations   The minimum heart rate was 40 bpm, maximum heart rate was 148 bpm, and average heart rate was 70 bpm. Predominant underlying rhythm was Sinus Rhythm.   6 Supraventricular Tachycardia runs occurred, the run with the fastest interval lasting 7 beats with a maximal rate of 148 bpm, the longest lasting 7 Beats.   Premature atrial complexes were rare (less than 1%). Premature Ventricular complexes were rare (less than 1%).   No ventricular tachycardia, no pauses, No AV block and no atrial fibrillation present. 3 patient triggered events: 1 associated with ventricular tachycardia, 2 associated with premature atrial  complexes   Conclusion: This study is  symptomatic paroxysmal supraventricular tachycardia which is likely atrial tachycardia with AV block, and rare symptomatic premature atrial complexes.    Recent Labs: No results found for requested labs within last 8760 hours.  Recent Lipid Panel No results found for: CHOL, TRIG, HDL, CHOLHDL, VLDL, LDLCALC, LDLDIRECT  Physical Exam:    VS:  BP 136/82 (BP Location: Right Arm, Patient Position: Sitting)   Pulse (!) 54   Ht 5\' 5"  (1.651 m)   Wt 195 lb 6.4 oz (88.6 kg)   SpO2 96%   BMI 32.52 kg/m     Wt Readings from Last 3 Encounters:  03/08/21 195 lb 6.4 oz (88.6 kg)  09/22/20 190 lb (86.2 kg)  03/17/20 190 lb 6.4 oz (86.4 kg)     GEN: Well nourished, well developed in no acute distress HEENT: Normal NECK:  No JVD; No carotid bruits LYMPHATICS: No lymphadenopathy CARDIAC: S1S2 noted,RRR, no murmurs, rubs, gallops RESPIRATORY:  Clear to auscultation without rales, wheezing or rhonchi  ABDOMEN: Soft, non-tender, non-distended, +bowel sounds, no guarding. EXTREMITIES: No edema, No cyanosis, no clubbing MUSCULOSKELETAL:  No deformity  SKIN: Warm and dry NEUROLOGIC:  Alert and oriented x 3, non-focal PSYCHIATRIC:  Normal affect, good insight  ASSESSMENT:    1. Coronary artery disease of native artery of native heart with stable angina pectoris (HCC)   2. PAT (paroxysmal atrial tachycardia) (HCC)   3. Primary hypertension   4. Mixed hyperlipidemia   5. Obesity (BMI 30-39.9)    PLAN:    She still is having some chest discomfort I am suspecting this is atypical angina.  She had not been taking the Imdur and says when she took a 30 mg it did not help her.  We will like to do is increase her Imdur to 60 mg daily.  She is not on antiplatelet we talked about this.  She cannot tolerate aspirin given GI upset.  We will try the patient on Plavix 75 mg daily.  She will continue on the Repatha.  Blood pressure is acceptable, continue with  current antihypertensive regimen.  The patient understands the need to lose weight with diet and exercise. We have discussed specific strategies for this.  The patient is in agreement with the above plan. The patient left the office in stable condition.  The patient will follow up in 1 year   Medication Adjustments/Labs and Tests Ordered: Current medicines are reviewed at length with the patient today.  Concerns regarding medicines are outlined above.  No orders of the defined types were placed in this encounter.  Meds ordered this encounter  Medications   clopidogrel (PLAVIX) 75 MG tablet    Sig: Take 1 tablet (75 mg total) by mouth daily.    Dispense:  90 tablet    Refill:  3   isosorbide mononitrate (IMDUR) 60 MG 24 hr tablet    Sig: Take 1 tablet (60 mg total) by mouth daily.    Dispense:  90 tablet    Refill:  3    Patient Instructions  Medication Instructions:  Your physician has recommended you make the following change in your medication:  START: Plavix 75 mg once daily START: Imdur 60 mg once daily *If you need a refill on your cardiac medications before your next appointment, please call your pharmacy*   Lab Work: None If you have labs (blood work) drawn today and your tests are completely normal, you will receive your results only by: MyChart Message (if you have MyChart) OR A paper copy in the mail If you have any lab test that is abnormal or we need to change your treatment, we will call you to review the results.   Testing/Procedures: None   Follow-Up: At [facility], you and your health needs are our priority.  As part of our continuing mission to provide you with exceptional heart care, we have created designated Provider Care Teams.  These Care Teams include your primary Cardiologist (physician) and Advanced Practice Providers (APPs -  Physician Assistants and Nurse Practitioners) who all work together to provide you with the care you need, when you  need it.  We recommend signing up for the patient portal called "MyChart".  Sign up information is provided on this After Visit Summary.  MyChart is used to connect with patients for Virtual Visits (Telemedicine).  Patients are able to  view lab/test results, encounter notes, upcoming appointments, etc.  Non-urgent messages can be sent to your provider as well.   To learn more about what you can do with MyChart, go to [email or web address].    Your next appointment:   1 year(s)  The format for your next appointment:   In Person  Provider:   Thomasene Ripple, DO [id number], [place], [place] [address]    Other Instructions     Adopting a Healthy Lifestyle.  Know what a healthy weight is for you (roughly BMI <25) and aim to maintain this   Aim for 7+ servings of fruits and vegetables daily   65-80+ fluid ounces of water or unsweet tea for healthy kidneys   Limit to max 1 drink of alcohol per day; avoid smoking/tobacco   Limit animal fats in diet for cholesterol and heart health - choose grass fed whenever available   Avoid highly processed foods, and foods high in saturated/trans fats   Aim for low stress - take time to unwind and care for your mental health   Aim for 150 min of moderate intensity exercise weekly for heart health, and weights twice weekly for bone health   Aim for 7-9 hours of sleep daily   When it comes to diets, agreement about the perfect plan isnt easy to find, even among the experts. Experts at the [facility] [employer] developed an idea known as the Healthy Eating Plate. Just imagine a plate divided into logical, healthy portions.   The emphasis is on diet quality:   Load up on vegetables and fruits - one-half of your plate: Aim for color and variety, and remember that potatoes dont count.   Go for whole grains - one-quarter of your plate: Whole wheat, barley, wheat berries, quinoa, oats, brown rice, and foods made with them. If you  want pasta, go with whole wheat pasta.   Protein power - one-quarter of your plate: Fish, chicken, beans, and nuts are all healthy, versatile protein sources. Limit red meat.   The diet, however, does go beyond the plate, offering a few other suggestions.   Use healthy plant oils, such as olive, canola, soy, corn, sunflower and peanut. Check the labels, and avoid partially hydrogenated oil, which have unhealthy trans fats.   If youre thirsty, drink water. Coffee and tea are good in moderation, but skip sugary drinks and limit milk and dairy products to one or two daily servings.   The type of carbohydrate in the diet is more important than the amount. Some sources of carbohydrates, such as vegetables, fruits, whole grains, and beans-are healthier than others.   Finally, stay active  Signed, Thomasene Ripple, DO  03/08/2021 4:31 PM    [facility]

## 2021-03-13 MED ORDER — RANOLAZINE ER 500 MG PO TB12: 500.0000 mg | ORAL_TABLET | Freq: Two times a day (BID) | ORAL | 3 refills | Status: DC

## 2021-03-28 DIAGNOSIS — R5382 Chronic fatigue, unspecified: Secondary | ICD-10-CM | POA: Diagnosis not present

## 2021-03-28 DIAGNOSIS — E7800 Pure hypercholesterolemia, unspecified: Secondary | ICD-10-CM | POA: Diagnosis not present

## 2021-04-13 ENCOUNTER — Other Ambulatory Visit: Payer: Self-pay | Admitting: Orthopedic Surgery

## 2021-04-13 DIAGNOSIS — M542 Cervicalgia: Secondary | ICD-10-CM | POA: Diagnosis not present

## 2021-04-13 DIAGNOSIS — M533 Sacrococcygeal disorders, not elsewhere classified: Secondary | ICD-10-CM | POA: Diagnosis not present

## 2021-05-22 DIAGNOSIS — M542 Cervicalgia: Secondary | ICD-10-CM | POA: Diagnosis not present

## 2021-07-20 ENCOUNTER — Ambulatory Visit
Admission: RE | Admit: 2021-07-20 | Discharge: 2021-07-20 | Disposition: A | Discharge status: Home / Self Care | Payer: BC Managed Care – PPO | Source: Ambulatory Visit | Attending: Orthopedic Surgery | Admitting: Orthopedic Surgery

## 2021-07-20 ENCOUNTER — Other Ambulatory Visit: Payer: Self-pay

## 2021-07-20 DIAGNOSIS — R102 Pelvic and perineal pain: Secondary | ICD-10-CM | POA: Diagnosis not present

## 2021-07-20 DIAGNOSIS — M533 Sacrococcygeal disorders, not elsewhere classified: Secondary | ICD-10-CM

## 2021-07-27 DIAGNOSIS — M4696 Unspecified inflammatory spondylopathy, lumbar region: Secondary | ICD-10-CM | POA: Diagnosis not present

## 2021-08-13 DIAGNOSIS — M533 Sacrococcygeal disorders, not elsewhere classified: Secondary | ICD-10-CM | POA: Diagnosis not present

## 2021-08-28 DIAGNOSIS — M533 Sacrococcygeal disorders, not elsewhere classified: Secondary | ICD-10-CM | POA: Diagnosis not present

## 2021-09-23 DIAGNOSIS — R509 Fever, unspecified: Secondary | ICD-10-CM | POA: Diagnosis not present

## 2021-09-23 DIAGNOSIS — R079 Chest pain, unspecified: Secondary | ICD-10-CM | POA: Diagnosis not present

## 2021-09-23 DIAGNOSIS — R42 Dizziness and giddiness: Secondary | ICD-10-CM | POA: Diagnosis not present

## 2021-09-23 DIAGNOSIS — R0789 Other chest pain: Secondary | ICD-10-CM | POA: Diagnosis not present

## 2021-09-23 DIAGNOSIS — Z743 Need for continuous supervision: Secondary | ICD-10-CM | POA: Diagnosis not present

## 2021-09-23 DIAGNOSIS — R6889 Other general symptoms and signs: Secondary | ICD-10-CM | POA: Diagnosis not present

## 2021-09-24 DIAGNOSIS — M533 Sacrococcygeal disorders, not elsewhere classified: Secondary | ICD-10-CM | POA: Diagnosis not present

## 2021-09-24 DIAGNOSIS — R079 Chest pain, unspecified: Secondary | ICD-10-CM | POA: Diagnosis not present

## 2021-09-27 DIAGNOSIS — E663 Overweight: Secondary | ICD-10-CM | POA: Diagnosis not present

## 2021-09-27 DIAGNOSIS — E559 Vitamin D deficiency, unspecified: Secondary | ICD-10-CM | POA: Diagnosis not present

## 2021-09-27 DIAGNOSIS — R5382 Chronic fatigue, unspecified: Secondary | ICD-10-CM | POA: Diagnosis not present

## 2021-09-27 DIAGNOSIS — Z0001 Encounter for general adult medical examination with abnormal findings: Secondary | ICD-10-CM | POA: Diagnosis not present

## 2021-12-18 DIAGNOSIS — L570 Actinic keratosis: Secondary | ICD-10-CM | POA: Diagnosis not present

## 2021-12-18 DIAGNOSIS — D2372 Other benign neoplasm of skin of left lower limb, including hip: Secondary | ICD-10-CM | POA: Diagnosis not present

## 2021-12-18 DIAGNOSIS — C44722 Squamous cell carcinoma of skin of right lower limb, including hip: Secondary | ICD-10-CM | POA: Diagnosis not present

## 2021-12-18 DIAGNOSIS — L820 Inflamed seborrheic keratosis: Secondary | ICD-10-CM | POA: Diagnosis not present

## 2021-12-18 DIAGNOSIS — D2371 Other benign neoplasm of skin of right lower limb, including hip: Secondary | ICD-10-CM | POA: Diagnosis not present

## 2022-01-08 ENCOUNTER — Other Ambulatory Visit: Payer: Self-pay | Admitting: Cardiology

## 2022-01-21 DIAGNOSIS — E669 Obesity, unspecified: Secondary | ICD-10-CM | POA: Diagnosis not present

## 2022-01-21 DIAGNOSIS — R7303 Prediabetes: Secondary | ICD-10-CM | POA: Diagnosis not present

## 2022-02-01 DIAGNOSIS — R1011 Right upper quadrant pain: Secondary | ICD-10-CM | POA: Diagnosis not present

## 2022-02-20 DIAGNOSIS — Z1231 Encounter for screening mammogram for malignant neoplasm of breast: Secondary | ICD-10-CM | POA: Diagnosis not present

## 2022-02-20 DIAGNOSIS — Z01419 Encounter for gynecological examination (general) (routine) without abnormal findings: Secondary | ICD-10-CM | POA: Diagnosis not present

## 2022-02-20 DIAGNOSIS — Z6832 Body mass index (BMI) 32.0-32.9, adult: Secondary | ICD-10-CM | POA: Diagnosis not present

## 2022-02-21 ENCOUNTER — Other Ambulatory Visit: Payer: Self-pay | Admitting: Obstetrics and Gynecology

## 2022-02-21 DIAGNOSIS — R928 Other abnormal and inconclusive findings on diagnostic imaging of breast: Secondary | ICD-10-CM

## 2022-02-26 ENCOUNTER — Ambulatory Visit
Admission: RE | Admit: 2022-02-26 | Discharge: 2022-02-26 | Disposition: A | Discharge status: Home / Self Care | Payer: BC Managed Care – PPO | Source: Ambulatory Visit | Attending: Obstetrics and Gynecology | Admitting: Obstetrics and Gynecology

## 2022-02-26 DIAGNOSIS — R928 Other abnormal and inconclusive findings on diagnostic imaging of breast: Secondary | ICD-10-CM | POA: Diagnosis not present

## 2022-02-26 DIAGNOSIS — R921 Mammographic calcification found on diagnostic imaging of breast: Secondary | ICD-10-CM | POA: Diagnosis not present

## 2022-04-02 DIAGNOSIS — N393 Stress incontinence (female) (male): Secondary | ICD-10-CM | POA: Diagnosis not present

## 2022-04-02 DIAGNOSIS — K5900 Constipation, unspecified: Secondary | ICD-10-CM | POA: Diagnosis not present

## 2022-04-02 DIAGNOSIS — M6281 Muscle weakness (generalized): Secondary | ICD-10-CM | POA: Diagnosis not present

## 2022-04-13 IMAGING — CT CT BIOPSY
1 of 5 series · 10 of 32 positions shown, 16 images · non-contrast
Comparison: none

CLINICAL DATA: Right posterior pelvic pain.

[Series 2: needle -guided injection · axial · 0.84mm/px · z∈[+533,+615]mm · 10 of 51 slices shown, 16 images]
[im 5/51  soft-tissue]
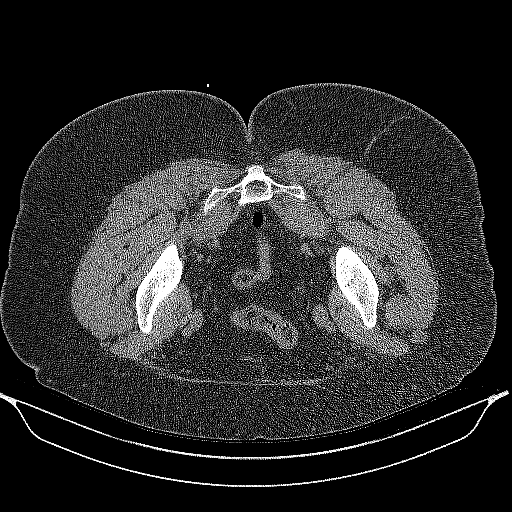
[im 5/51  bone]
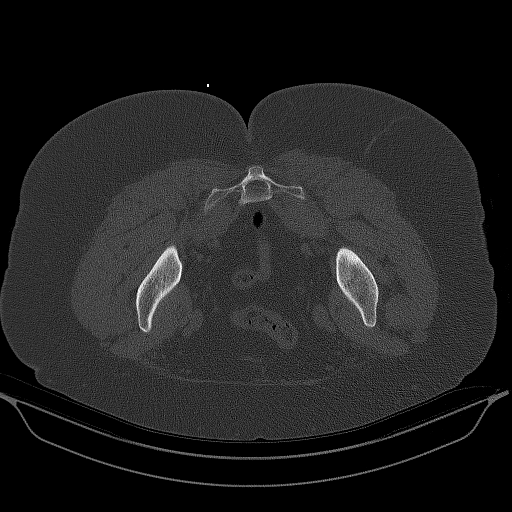
[im 10/51  soft-tissue]
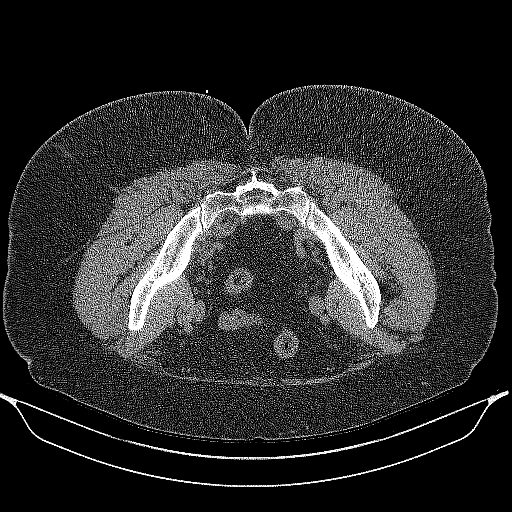
[im 14/51  soft-tissue]
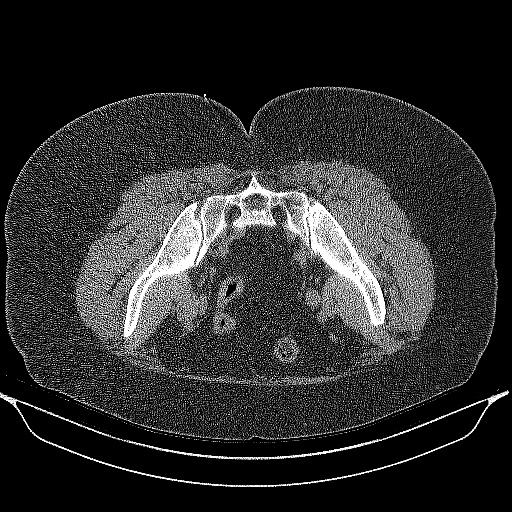
[im 19/51  soft-tissue]
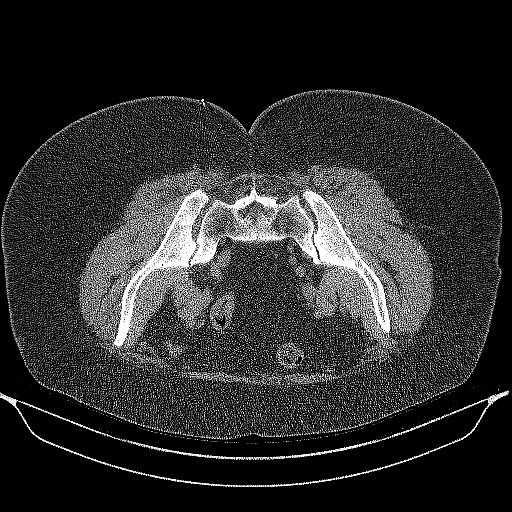
[im 23/51  soft-tissue]
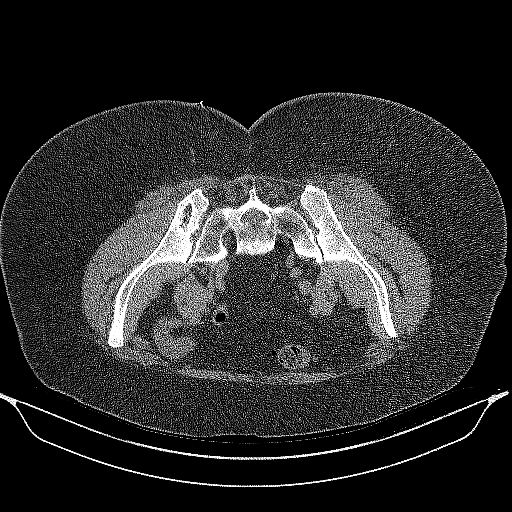
[im 28/51  soft-tissue]
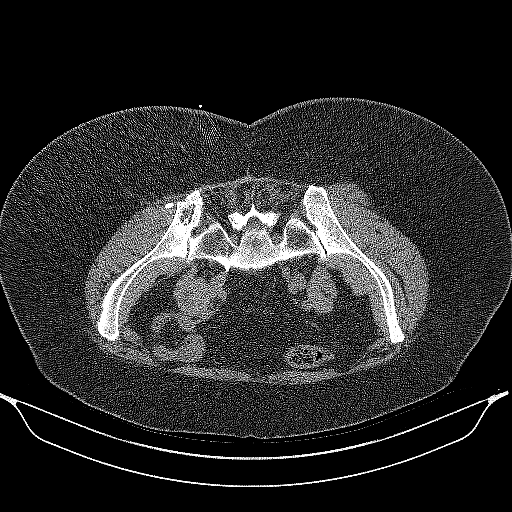
[im 32/51  soft-tissue]
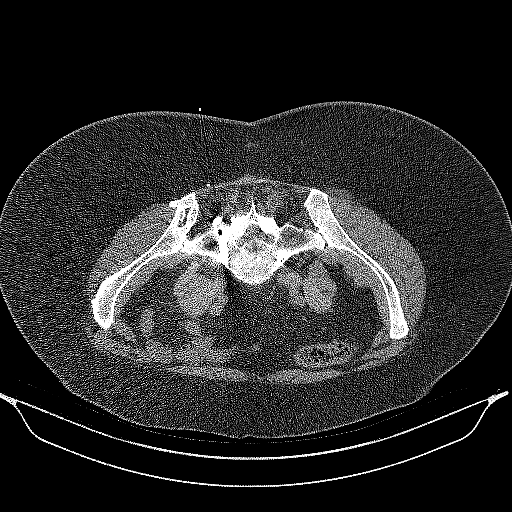
[im 32/51  lung]
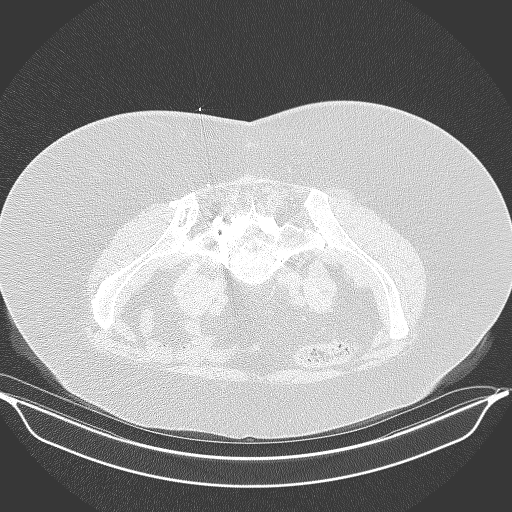
[im 37/51  soft-tissue]
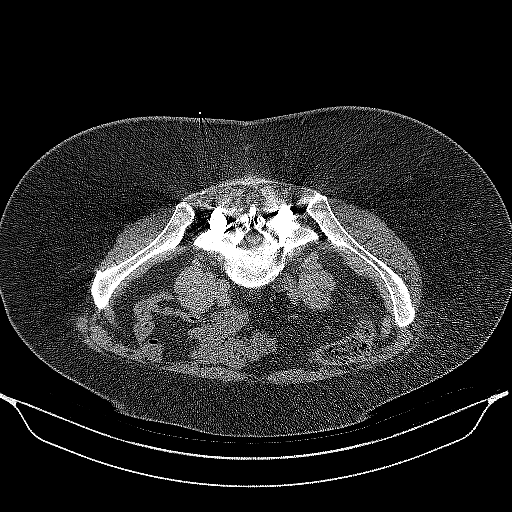
[im 37/51  lung]
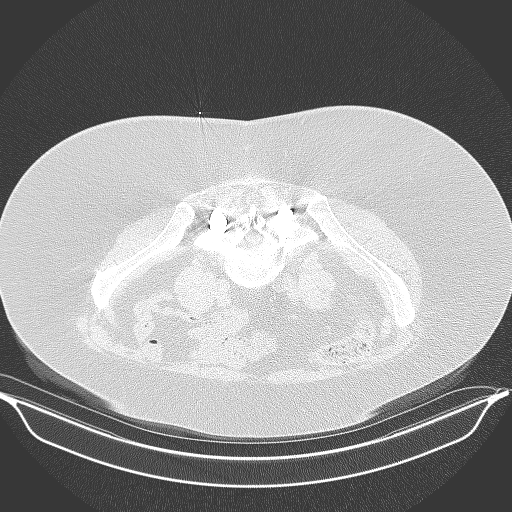
[im 41/51  soft-tissue]
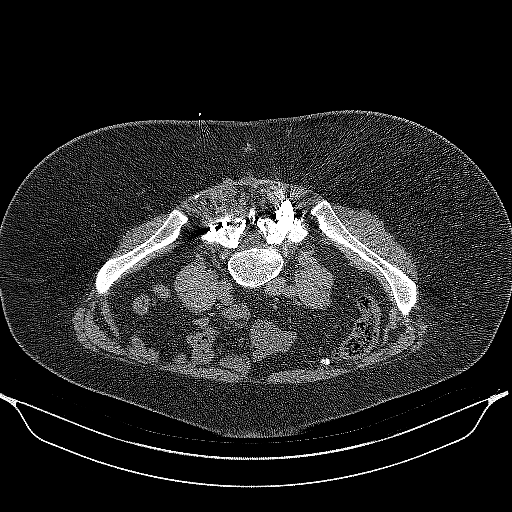
[im 41/51  lung]
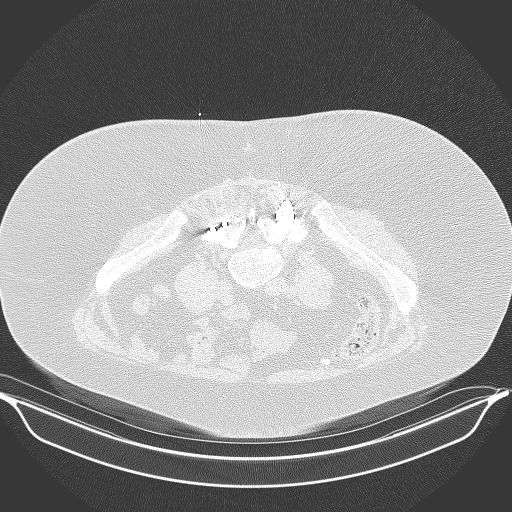
[im 41/51  bone]
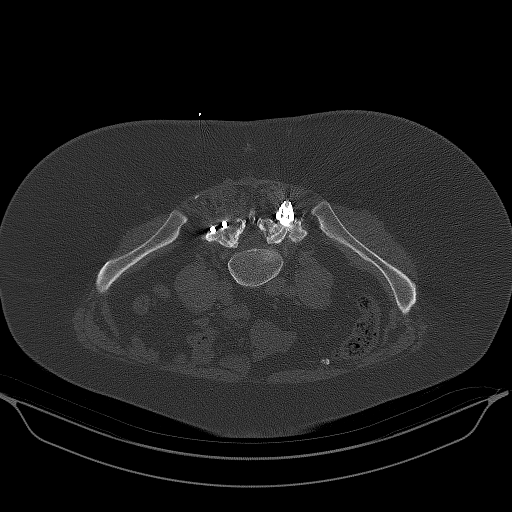
[im 46/51  soft-tissue]
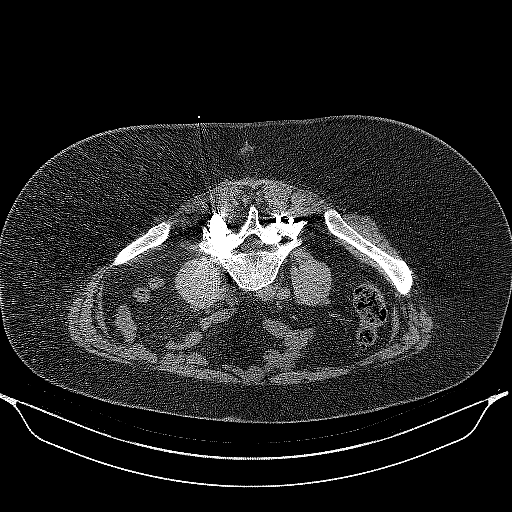
[im 46/51  lung]
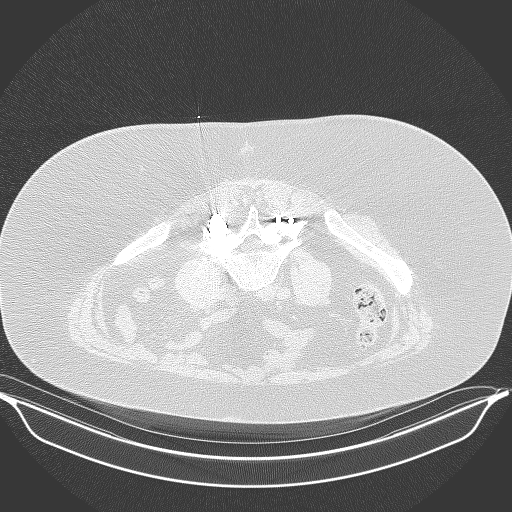

[10 of 32 positions shown; findings below may reference images not displayed]

EXAM:
Right CT GUIDED SI JOINT INJECTION

RADIATION DOSE REDUCTION: This exam was performed according to the
departmental dose-optimization program which includes automated
exposure control, adjustment of the mA and/or kV according to
patient size and/or use of iterative reconstruction technique.



After local anesthesia with 1% lidocaine without epinephrine and
subsequent deep anesthesia, a 22 gauge spinal needle was advanced
into the right SI joint under intermittent CT guidance.

Once the needle was in satisfactory position, representative image
was captured with the needle demonstrated in the sacroiliac joint.
Subsequently, 3 mL 0.5% bupivacaine was injected into the right SI
joint. Needles removed and a sterile dressing applied. The injection
produced cord pain.

Patient experienced immediate relief of her pain following the
injection. She reported no pain while ambulating. She was instructed
to monitor and document her symptoms over the next 48 hours.

No complications were observed.
IMPRESSION: Successful CT-guided right SI joint injection.

## 2022-04-19 DIAGNOSIS — M533 Sacrococcygeal disorders, not elsewhere classified: Secondary | ICD-10-CM | POA: Diagnosis not present

## 2022-04-23 ENCOUNTER — Other Ambulatory Visit: Payer: Self-pay | Admitting: Orthopedic Surgery

## 2022-04-23 DIAGNOSIS — G8929 Other chronic pain: Secondary | ICD-10-CM

## 2022-04-30 ENCOUNTER — Other Ambulatory Visit: Payer: BC Managed Care – PPO

## 2022-05-07 DIAGNOSIS — M6281 Muscle weakness (generalized): Secondary | ICD-10-CM | POA: Diagnosis not present

## 2022-05-07 DIAGNOSIS — N393 Stress incontinence (female) (male): Secondary | ICD-10-CM | POA: Diagnosis not present

## 2022-05-07 DIAGNOSIS — K5900 Constipation, unspecified: Secondary | ICD-10-CM | POA: Diagnosis not present

## 2022-05-21 DIAGNOSIS — D485 Neoplasm of uncertain behavior of skin: Secondary | ICD-10-CM | POA: Diagnosis not present

## 2022-05-21 DIAGNOSIS — K5900 Constipation, unspecified: Secondary | ICD-10-CM | POA: Diagnosis not present

## 2022-05-21 DIAGNOSIS — Z85828 Personal history of other malignant neoplasm of skin: Secondary | ICD-10-CM | POA: Diagnosis not present

## 2022-05-21 DIAGNOSIS — D225 Melanocytic nevi of trunk: Secondary | ICD-10-CM | POA: Diagnosis not present

## 2022-05-21 DIAGNOSIS — D2262 Melanocytic nevi of left upper limb, including shoulder: Secondary | ICD-10-CM | POA: Diagnosis not present

## 2022-05-21 DIAGNOSIS — L821 Other seborrheic keratosis: Secondary | ICD-10-CM | POA: Diagnosis not present

## 2022-05-21 DIAGNOSIS — N393 Stress incontinence (female) (male): Secondary | ICD-10-CM | POA: Diagnosis not present

## 2022-05-21 DIAGNOSIS — M6281 Muscle weakness (generalized): Secondary | ICD-10-CM | POA: Diagnosis not present

## 2022-05-21 DIAGNOSIS — D2261 Melanocytic nevi of right upper limb, including shoulder: Secondary | ICD-10-CM | POA: Diagnosis not present

## 2022-06-06 ENCOUNTER — Ambulatory Visit
Admission: RE | Admit: 2022-06-06 | Discharge: 2022-06-06 | Disposition: A | Discharge status: Home / Self Care | Payer: BC Managed Care – PPO | Source: Ambulatory Visit | Attending: Orthopedic Surgery | Admitting: Orthopedic Surgery

## 2022-06-06 DIAGNOSIS — G8929 Other chronic pain: Secondary | ICD-10-CM

## 2022-06-06 DIAGNOSIS — M545 Low back pain, unspecified: Secondary | ICD-10-CM | POA: Diagnosis not present

## 2022-06-06 MED ORDER — METHYLPREDNISOLONE ACETATE 80 MG/ML IJ SUSP: 80.0000 mg | Freq: Once | INTRAMUSCULAR | Status: DC

## 2022-06-25 DIAGNOSIS — N393 Stress incontinence (female) (male): Secondary | ICD-10-CM | POA: Diagnosis not present

## 2022-06-25 DIAGNOSIS — K5900 Constipation, unspecified: Secondary | ICD-10-CM | POA: Diagnosis not present

## 2022-06-25 DIAGNOSIS — M6281 Muscle weakness (generalized): Secondary | ICD-10-CM | POA: Diagnosis not present

## 2022-07-03 DIAGNOSIS — R7303 Prediabetes: Secondary | ICD-10-CM | POA: Diagnosis not present

## 2022-07-03 DIAGNOSIS — E669 Obesity, unspecified: Secondary | ICD-10-CM | POA: Diagnosis not present

## 2022-07-23 DIAGNOSIS — K5900 Constipation, unspecified: Secondary | ICD-10-CM | POA: Diagnosis not present

## 2022-07-23 DIAGNOSIS — M6281 Muscle weakness (generalized): Secondary | ICD-10-CM | POA: Diagnosis not present

## 2022-07-23 DIAGNOSIS — N393 Stress incontinence (female) (male): Secondary | ICD-10-CM | POA: Diagnosis not present

## 2022-08-13 DIAGNOSIS — N393 Stress incontinence (female) (male): Secondary | ICD-10-CM | POA: Diagnosis not present

## 2022-08-13 DIAGNOSIS — M6281 Muscle weakness (generalized): Secondary | ICD-10-CM | POA: Diagnosis not present

## 2022-08-13 DIAGNOSIS — K5900 Constipation, unspecified: Secondary | ICD-10-CM | POA: Diagnosis not present

## 2022-09-25 DIAGNOSIS — R7303 Prediabetes: Secondary | ICD-10-CM | POA: Diagnosis not present

## 2022-09-25 DIAGNOSIS — E669 Obesity, unspecified: Secondary | ICD-10-CM | POA: Diagnosis not present

## 2023-06-14 ENCOUNTER — Emergency Department (HOSPITAL_COMMUNITY)
Admission: EM | Admit: 2023-06-14 | Discharge: 2023-06-15 | Disposition: A | Discharge status: Home / Self Care | Payer: 59 | Attending: Emergency Medicine | Admitting: Emergency Medicine

## 2023-06-14 ENCOUNTER — Emergency Department (HOSPITAL_COMMUNITY): Payer: 59

## 2023-06-14 DIAGNOSIS — Z79899 Other long term (current) drug therapy: Secondary | ICD-10-CM | POA: Insufficient documentation

## 2023-06-14 DIAGNOSIS — I10 Essential (primary) hypertension: Secondary | ICD-10-CM | POA: Insufficient documentation

## 2023-06-14 DIAGNOSIS — Z7902 Long term (current) use of antithrombotics/antiplatelets: Secondary | ICD-10-CM | POA: Diagnosis not present

## 2023-06-14 DIAGNOSIS — N132 Hydronephrosis with renal and ureteral calculous obstruction: Secondary | ICD-10-CM | POA: Insufficient documentation

## 2023-06-14 DIAGNOSIS — I2510 Atherosclerotic heart disease of native coronary artery without angina pectoris: Secondary | ICD-10-CM | POA: Insufficient documentation

## 2023-06-14 DIAGNOSIS — R109 Unspecified abdominal pain: Secondary | ICD-10-CM | POA: Diagnosis present

## 2023-06-14 DIAGNOSIS — N23 Unspecified renal colic: Secondary | ICD-10-CM

## 2023-06-14 LAB — CBC WITH DIFFERENTIAL/PLATELET
Abs Immature Granulocytes: 0.03 10*3/uL (ref 0.00–0.07)
Basophils Absolute: 0 10*3/uL (ref 0.0–0.1)
Basophils Relative: 1 %
Eosinophils Absolute: 0.1 10*3/uL (ref 0.0–0.5)
Eosinophils Relative: 2 %
HCT: 39.1 % (ref 36.0–46.0)
Hemoglobin: 12.9 g/dL (ref 12.0–15.0)
Immature Granulocytes: 0 %
Lymphocytes Relative: 20 %
Lymphs Abs: 1.5 10*3/uL (ref 0.7–4.0)
MCH: 29.7 pg (ref 26.0–34.0)
MCHC: 33 g/dL (ref 30.0–36.0)
MCV: 89.9 fL (ref 80.0–100.0)
Monocytes Absolute: 0.6 10*3/uL (ref 0.1–1.0)
Monocytes Relative: 8 %
Neutro Abs: 5.3 10*3/uL (ref 1.7–7.7)
Neutrophils Relative %: 69 %
Platelets: 365 10*3/uL (ref 150–400)
RBC: 4.35 MIL/uL (ref 3.87–5.11)
RDW: 12.7 % (ref 11.5–15.5)
WBC: 7.6 10*3/uL (ref 4.0–10.5)
nRBC: 0 % (ref 0.0–0.2)

## 2023-06-14 LAB — URINALYSIS, ROUTINE W REFLEX MICROSCOPIC
Bilirubin Urine: NEGATIVE
Glucose, UA: NEGATIVE mg/dL
Ketones, ur: NEGATIVE mg/dL
Leukocytes,Ua: NEGATIVE
Nitrite: NEGATIVE
Protein, ur: NEGATIVE mg/dL
Specific Gravity, Urine: 1.003 — ABNORMAL LOW (ref 1.005–1.030)
pH: 7 (ref 5.0–8.0)

## 2023-06-14 LAB — COMPREHENSIVE METABOLIC PANEL
ALT: 30 U/L (ref 0–44)
AST: 38 U/L (ref 15–41)
Albumin: 4.4 g/dL (ref 3.5–5.0)
Alkaline Phosphatase: 59 U/L (ref 38–126)
Anion gap: 10 (ref 5–15)
BUN: 23 mg/dL — ABNORMAL HIGH (ref 6–20)
CO2: 24 mmol/L (ref 22–32)
Calcium: 9.9 mg/dL (ref 8.9–10.3)
Chloride: 103 mmol/L (ref 98–111)
Creatinine, Ser: 1.76 mg/dL — ABNORMAL HIGH (ref 0.44–1.00)
GFR, Estimated: 33 mL/min — ABNORMAL LOW (ref >60)
Glucose, Bld: 108 mg/dL — ABNORMAL HIGH (ref 70–99)
Potassium: 4.4 mmol/L (ref 3.5–5.1)
Sodium: 137 mmol/L (ref 135–145)
Total Bilirubin: 0.7 mg/dL (ref <1.2)
Total Protein: 7.9 g/dL (ref 6.5–8.1)

## 2023-06-14 LAB — LIPASE, BLOOD: Lipase: 31 U/L (ref 11–51)

## 2023-06-14 MED ORDER — OXYCODONE-ACETAMINOPHEN 5-325 MG PO TABS
1.0000 | ORAL_TABLET | Freq: Once | ORAL | Status: AC
Start: 2023-06-14 — End: 2023-06-15
  Administered 2023-06-15: 1 via ORAL
  Filled 2023-06-14: qty 1

## 2023-06-14 NOTE — ED Provider Triage Note (Signed)
Emergency Medicine Provider Triage Evaluation Note  Shirley Barber , a 57 y.o. female  was evaluated in triage.  Pt complains of left flank pain. States same began initially 1.5 weeks ago, is intermittent. Went to [facility] internal medicine yesterday and had renal US which reportedly showed hydronephrosis without stones. Was sent here for evaluation. Notes history of similar pain previously, thinks she had a kidney stone previously but is not sure.  Review of Systems  Positive:  Negative:   Physical Exam  BP (!) 156/96 (BP Location: Left Arm)   Pulse 79   Temp 97.9 F (36.6 C) (Oral)   Resp 16   Ht 5\' 5"  (1.651 m)   Wt 73.7 kg   SpO2 100%   BMI 27.02 kg/m  Gen:   Awake, no distress   Resp:  Normal effort  MSK:   Moves extremities without difficulty  Other:  Left CVA tenderness  Medical Decision Making  Medically screening exam initiated at 1:51 PM.  Appropriate orders placed.  HIKARI DIEKER was informed that the remainder of the evaluation will be completed by another provider, this initial triage assessment does not replace that evaluation, and the importance of remaining in the ED until their evaluation is complete.  Work-up initiated   Vear Clock 06/14/23 1353

## 2023-06-14 NOTE — ED Triage Notes (Signed)
Patient reports pain in left flank x 1.5 weeks Had ultrasound yesterday for kidney stones, nothing seen Was told to come to ED for CT  Pain rated 10/10 Peeing very little

## 2023-06-15 DIAGNOSIS — N132 Hydronephrosis with renal and ureteral calculous obstruction: Secondary | ICD-10-CM | POA: Diagnosis not present

## 2023-06-15 MED ORDER — TAMSULOSIN HCL 0.4 MG PO CAPS: 0.4000 mg | ORAL_CAPSULE | Freq: Every day | ORAL | 0 refills | Status: AC

## 2023-06-15 MED ORDER — SODIUM CHLORIDE 0.9 % IV BOLUS
500.0000 mL | Freq: Once | INTRAVENOUS | Status: AC
Start: 2023-06-15 — End: 2023-06-15
  Administered 2023-06-15: 500 mL via INTRAVENOUS

## 2023-06-15 MED ORDER — ONDANSETRON 4 MG PO TBDP: 4.0000 mg | ORAL_TABLET | Freq: Three times a day (TID) | ORAL | 0 refills | Status: AC | PRN

## 2023-06-15 MED ORDER — KETOROLAC TROMETHAMINE 10 MG PO TABS: 10.0000 mg | ORAL_TABLET | Freq: Three times a day (TID) | ORAL | 0 refills | Status: DC | PRN

## 2023-06-15 MED ORDER — TAMSULOSIN HCL 0.4 MG PO CAPS
0.4000 mg | ORAL_CAPSULE | Freq: Once | ORAL | Status: AC
  Administered 2023-06-15: 0.4 mg via ORAL
  Filled 2023-06-15: qty 1

## 2023-06-15 MED ORDER — KETOROLAC TROMETHAMINE 15 MG/ML IJ SOLN
15.0000 mg | Freq: Once | INTRAMUSCULAR | Status: AC
  Administered 2023-06-15: 15 mg via INTRAVENOUS
  Filled 2023-06-15: qty 1

## 2023-06-15 MED ORDER — HYDROCODONE-ACETAMINOPHEN 5-325 MG PO TABS: 2.0000 | ORAL_TABLET | ORAL | 0 refills | Status: DC | PRN

## 2023-06-15 NOTE — ED Provider Notes (Signed)
[place] EMERGENCY DEPARTMENT AT [facility] Provider Note  CSN: [id number] Arrival date & time: 06/14/23 1302  Chief Complaint(s) Flank Pain  HPI Shirley Barber is a 57 y.o. female with a past medical history listed below who presents to the emergency department with 1 to 1-1/2 weeks of intermittent left-sided flank pain fluctuating in intensity.  Worse over the past 1 to 2 days.  The patient has had prior renal stones in the past that it felt similar.  She has tried previously prescribed Toradol and Flomax with minimal relief.  She was recently prescribed tramadol which has not provided any relief.  Given the intensity of the pain, she presented for evaluation.  Patient also endorsed nausea without emesis.  No other physical complaints  The history is provided by the patient.    Past Medical History Past Medical History:  Diagnosis Date   Abnormal FFR CT 08/12/2019   Chest pain 06/24/2019   Coronary artery disease of native artery of native heart with stable angina pectoris (HCC) 08/12/2019   HTN (hypertension)    Hyperlipidemia    Hypertension 06/24/2019   Palpitations 06/24/2019   PAT (paroxysmal atrial tachycardia) (HCC) 08/12/2019   Pre-procedure lab exam 08/12/2019   Shortness of breath 06/24/2019   Patient Active Problem List   Diagnosis Date Noted   Hyperlipidemia    HTN (hypertension)    Mixed hyperlipidemia 09/13/2019   Obesity (BMI 30-39.9) 09/13/2019   Abnormal FFR CT 08/12/2019   Coronary artery disease of native artery of native heart with stable angina pectoris (HCC) 08/12/2019   Pre-procedure lab exam 08/12/2019   PAT (paroxysmal atrial tachycardia) (HCC) 08/12/2019   Chest pain 06/24/2019   Hypertension 06/24/2019   Palpitations 06/24/2019   Shortness of breath 06/24/2019   Home Medication(s) Prior to Admission medications   Medication Sig Start Date End Date Taking? Authorizing Provider  HYDROcodone-acetaminophen (NORCO/VICODIN) 5-325 MG  tablet Take 2 tablets by mouth every 4 (four) hours as needed. 06/15/23  Yes Debi Cousin, Amadeo Garnet, MD  ketorolac (TORADOL) 10 MG tablet Take 1 tablet (10 mg total) by mouth every 8 (eight) hours as needed. 06/15/23  Yes Diron Haddon, Amadeo Garnet, MD  ondansetron (ZOFRAN-ODT) 4 MG disintegrating tablet Take 1 tablet (4 mg total) by mouth every 8 (eight) hours as needed for up to 3 days for nausea or vomiting. 06/15/23 06/18/23 Yes Rayn Shorb, Amadeo Garnet, MD  tamsulosin (FLOMAX) 0.4 MG CAPS capsule Take 1 capsule (0.4 mg total) by mouth daily for 5 days. 06/15/23 06/20/23 Yes Luvenia Cranford, Amadeo Garnet, MD  clopidogrel (PLAVIX) 75 MG tablet TAKE 1 TABLET(75 MG) BY MOUTH DAILY 01/09/22   Tobb, Kardie, DO  metoprolol succinate (TOPROL XL) 25 MG 24 hr tablet Take 0.5 tablets (12.5 mg total) by mouth in the morning and at bedtime. 10/20/20   Tobb, Kardie, DO  nitroGLYCERIN (NITROSTAT) 0.4 MG SL tablet Place 1 tablet (0.4 mg total) under the tongue every 5 (five) minutes as needed. Patient taking differently: Place 0.4 mg under the tongue every 5 (five) minutes as needed for chest pain. 06/24/19 03/08/21  Tobb, Kardie, DO  omeprazole (PRILOSEC) 20 MG capsule Take 20 mg by mouth 2 (two) times daily as needed (GERD, HEART BURN). 12/03/19   [provider]  ranolazine (RANEXA) 500 MG 12 hr tablet Take 1 tablet (500 mg total) by mouth 2 (two) times daily. 03/13/21   Tobb, Kardie, DO  REPATHA SURECLICK 140 MG/ML SOAJ Inject 1 mL into the skin every 14 (fourteen) days.  06/16/19   [provider]  valACYclovir (VALTREX) 500 MG tablet Take 500 mg by mouth as needed (outbreak).     [provider]                                                                                                                                    Allergies Morphine, Nitrofurantoin, Aspirin, Other, and Statins  Review of Systems Review of Systems As noted in HPI  Physical Exam Vital Signs  I have reviewed the  triage vital signs BP 104/68 (BP Location: Left Arm)   Pulse 64   Temp 98 F (36.7 C) (Oral)   Resp 16   Ht 5\' 5"  (1.651 m)   Wt 73.7 kg   SpO2 97%   BMI 27.02 kg/m   Physical Exam Vitals reviewed.  Constitutional:      General: She is not in acute distress.    Appearance: She is well-developed. She is not diaphoretic.  HENT:     Head: Normocephalic and atraumatic.     Right Ear: External ear normal.     Left Ear: External ear normal.     Nose: Nose normal.  Eyes:     General: No scleral icterus.    Conjunctiva/sclera: Conjunctivae normal.  Neck:     Trachea: Phonation normal.  Cardiovascular:     Rate and Rhythm: Normal rate and regular rhythm.  Pulmonary:     Effort: Pulmonary effort is normal. No respiratory distress.     Breath sounds: No stridor.  Abdominal:     General: There is no distension.     Tenderness: There is left CVA tenderness.  Musculoskeletal:        General: Normal range of motion.     Cervical back: Normal range of motion.  Neurological:     Mental Status: She is alert and oriented to person, place, and time.  Psychiatric:        Behavior: Behavior normal.     ED Results and Treatments Labs (all labs ordered are listed, but only abnormal results are displayed) Labs Reviewed  URINALYSIS, ROUTINE W REFLEX MICROSCOPIC - Abnormal; Notable for the following components:      Result Value   Color, Urine COLORLESS (*)    Specific Gravity, Urine 1.003 (*)    Hgb urine dipstick LARGE (*)    Bacteria, UA RARE (*)    All other components within normal limits  COMPREHENSIVE METABOLIC PANEL - Abnormal; Notable for the following components:   Glucose, Bld 108 (*)    BUN 23 (*)    Creatinine, Ser 1.76 (*)    GFR, Estimated 33 (*)    All other components within normal limits  LIPASE, BLOOD  CBC WITH DIFFERENTIAL/PLATELET  EKG  EKG  Interpretation Date/Time:    Ventricular Rate:    PR Interval:    QRS Duration:    QT Interval:    QTC Calculation:   R Axis:      Text Interpretation:         Radiology CT Renal Stone Study Result Date: 06/14/2023 CLINICAL DATA:  Abdominal/flank pain, stone suspected. Left flank pain. EXAM: CT ABDOMEN AND PELVIS WITHOUT CONTRAST TECHNIQUE: Multidetector CT imaging of the abdomen and pelvis was performed following the standard protocol without IV contrast. RADIATION DOSE REDUCTION: This exam was performed according to the departmental dose-optimization program which includes automated exposure control, adjustment of the mA and/or kV according to patient size and/or use of iterative reconstruction technique. COMPARISON:  CT abdomen/pelvis dated May 09, 2014. FINDINGS: Lower chest: No acute abnormality. Minimal right basilar linear atelectasis/scarring. Hepatobiliary: Again seen are hepatic cysts in the right hepatic lobe, with the largest measuring up to 7.3 x 5.3 cm. Status post cholecystectomy. No biliary dilatation. Pancreas: Unremarkable. No pancreatic ductal dilatation or surrounding inflammatory changes. Spleen: Normal in size without focal abnormality. Adrenals/Urinary Tract: Adrenal glands are unremarkable. There is a 5 mm calculus in the distal left ureter, just proximal to the left ureterovesicular junction, with moderate left-sided hydroureteronephrosis (series 2, image 81). No additional left-sided renal calculi identified. No right-sided renal calculi or hydronephrosis. Bladder is unremarkable. Stomach/Bowel: Stomach is within normal limits. The appendix is not clearly visualized. No pericecal inflammatory changes. No evidence of bowel wall thickening, distention, or inflammatory changes. Scattered sigmoid colonic diverticulosis without evidence of acute diverticulitis. Vascular/Lymphatic: Aortic atherosclerosis. No enlarged abdominal or pelvic lymph nodes. Reproductive: Status post  hysterectomy. No adnexal masses. Other: No abdominal wall hernia or abnormality. No abdominopelvic ascites. No intraperitoneal free air. Musculoskeletal: No acute osseous abnormality. Stable posterior fusion at L5-S1 with bone graft harvest site/defect involving the right iliac bone. IMPRESSION: 1. 5 mm calculus in the distal left ureter, just proximal to the left ureterovesicular junction, with moderate left-sided hydroureteronephrosis. 2. Scattered sigmoid colonic diverticulosis without evidence of acute diverticulitis. Aortic Atherosclerosis (ICD10-I70.0). Electronically Signed   By: Hart Robinsons M.D.   On: 06/14/2023 15:05    Medications Ordered in ED Medications  oxyCODONE-acetaminophen (PERCOCET/ROXICET) 5-325 MG per tablet 1-2 tablet (1 tablet Oral Given 06/15/23 0054)  ketorolac (TORADOL) 15 MG/ML injection 15 mg (15 mg Intravenous Given 06/15/23 0150)  sodium chloride 0.9 % bolus 500 mL (0 mLs Intravenous Stopped 06/15/23 0251)  tamsulosin (FLOMAX) capsule 0.4 mg (0.4 mg Oral Given 06/15/23 0149)   Procedures Procedures  (including critical care time) Medical Decision Making / ED Course   Medical Decision Making Amount and/or Complexity of Data Reviewed Labs: ordered.  Risk Prescription drug management.     Complexity of Problem:  Co-morbidities/SDOH that complicate the patient evaluation/care: Noted in HPI    Patient's presenting problem/concern, DDX, and MDM listed below: Left flank pain Renal colic, pyelonephritis, diverticulitis/other intra-abdominal [place]/infectious processes.  Hospitalization Considered:  Yes  Initial Intervention:  Percocet    Complexity of Data:   Cardiac Monitoring/EKG: N/A  Laboratory Tests ordered listed below with my independent interpretation: CBC without leukocytosis or anemia Metabolic panel without significant electrolyte derangements.  Mild renal insufficiency without overt AKI.  No biliary obstruction or  pancreatitis. UA with hematuria but not concerning for infection.   Imaging Studies ordered listed below with my independent interpretation: CT stone study notable for the 5 mm left distal ureter/UVJ stone.  Moderate hydronephrosis.  No other acute  findings.     ED Course:    Assessment, Add'l Intervention, and Reassessment: Left flank pain related to ureteral stone.  Patient provided with IV Toradol, Flomax and IV fluids. Pain control with above meds.     Final Clinical Impression(s) / ED Diagnoses Final diagnoses:  Ureteral colic  Renal colic   The patient appears reasonably screened and/or stabilized for discharge and I doubt any other medical condition or other [facility] requiring further screening, evaluation, or treatment in the ED at this time. I have discussed the findings, Dx and Tx plan with the patient/family who expressed understanding and agree(s) with the plan. Discharge instructions discussed at length. The patient/family was given strict return precautions who verbalized understanding of the instructions. No further questions at time of discharge.  Disposition: Discharge  Condition: Good  ED Discharge Orders          Ordered    tamsulosin (FLOMAX) 0.4 MG CAPS capsule  Daily        06/15/23 0334    ketorolac (TORADOL) 10 MG tablet  Every 8 hours PRN        06/15/23 0334    HYDROcodone-acetaminophen (NORCO/VICODIN) 5-325 MG tablet  Every 4 hours PRN        06/15/23 0334    ondansetron (ZOFRAN-ODT) 4 MG disintegrating tablet  Every 8 hours PRN        06/15/23 0334            [facility] narcotic database reviewed and no active prescriptions noted.   Follow Up: Olevia Bowens, FNP [address] [place] [address] [phone]  Call  to schedule an appointment for close follow up  Rene Paci, MD [address] [place] [address] [phone]  Call  to schedule an appointment for close follow up    This chart was  dictated using voice recognition software.  Despite best efforts to proofread,  errors can occur which can change the documentation meaning.    Nira Conn, MD 06/15/23 [phone]

## 2023-10-22 LAB — LAB REPORT - SCANNED
A1c: 5.3
EGFR: 59

## 2023-10-23 ENCOUNTER — Telehealth: Payer: Self-pay

## 2023-10-23 NOTE — Telephone Encounter (Signed)
Left message to call back and schedule in office appt for pre op clearance.

## 2023-10-23 NOTE — Telephone Encounter (Signed)
   Name: Shirley Barber  DOB: [birth year]  MRN: [id number]  Primary Cardiologist: Jerryl Morin, DO Last OV: 02/2021  Chart reviewed as part of pre-operative protocol coverage. Because of [name] past medical history and time since last visit, she will require a follow-up in-office visit in order to better assess preoperative cardiovascular risk.  Pre-op covering staff: - Please schedule appointment and call patient to inform them. If patient already had an upcoming appointment within acceptable timeframe, please add "pre-op clearance" to the appointment notes so provider is aware. - Please contact requesting surgeon's office via preferred method (i.e, phone, fax) to inform them of need for appointment prior to surgery.  Daniell Paradise D Makhai Fulco, NP  10/23/2023, 5:12 PM

## 2023-10-23 NOTE — Telephone Encounter (Signed)
   Pre-operative Risk Assessment    Patient Name: Shirley Barber  DOB: 08-Feb-1966 MRN: [id number]   Date of last office visit: 03/08/21 KARDIE TOBB, DO Date of next office visit: NONE   Request for Surgical Clearance    Procedure:   BODY LIPOSUCTION AND E/X  Date of Surgery:  Clearance 10/29/23                                Surgeon:  DR Genita Keys Surgeon's Group or Practice Name:  [facility] Phone number:  [phone] Fax number:  [phone]   Type of Clearance Requested:   - Medical  - Pharmacy:  Hold Clopidogrel  (Plavix )     Type of Anesthesia:  Local LIDOCAINE    Additional requests/questions:    SignedCollin Deal   10/23/2023, 4:45 PM

## 2023-10-24 ENCOUNTER — Ambulatory Visit: Attending: Cardiology | Admitting: Cardiology

## 2023-10-24 ENCOUNTER — Other Ambulatory Visit: Payer: Self-pay

## 2023-10-24 VITALS — BP 110/70 | HR 70 | Ht 64.6 in | Wt 160.8 lb

## 2023-10-24 DIAGNOSIS — Z01810 Encounter for preprocedural cardiovascular examination: Secondary | ICD-10-CM

## 2023-10-24 DIAGNOSIS — Z01818 Encounter for other preprocedural examination: Secondary | ICD-10-CM

## 2023-10-24 DIAGNOSIS — E782 Mixed hyperlipidemia: Secondary | ICD-10-CM

## 2023-10-24 DIAGNOSIS — I10 Essential (primary) hypertension: Secondary | ICD-10-CM | POA: Diagnosis not present

## 2023-10-24 DIAGNOSIS — I25118 Atherosclerotic heart disease of native coronary artery with other forms of angina pectoris: Secondary | ICD-10-CM

## 2023-10-24 HISTORY — DX: Encounter for preprocedural cardiovascular examination: Z01.810

## 2023-10-24 NOTE — Telephone Encounter (Signed)
 Called pt about needing to schedule in office appt for pre op clearance. Informed pt that we do not have an opening until after date of procedure.   Pt stated that her PCP Jeffrey Mini, FNP called and talked to Dr. Emmette Harms about doing a virtual visit. Will connect with Dr. Emmette Harms to conform information. PT Last seen 2022  [facility] returned call and informed them that now in office appt not until at least 5/19 if still available.

## 2023-10-24 NOTE — Telephone Encounter (Signed)
 Patient is returning call.

## 2023-10-24 NOTE — Telephone Encounter (Signed)
 Per Renee Carrow, RN: Thank you. She will have to be seen in person since its been several years since she has been seen. She can see an APP as well.   Pt scheduled at [facility] (per pt request) 10/24/23 @ 1:55 pm with Pattricia Bores, NP

## 2023-10-24 NOTE — Patient Instructions (Signed)
 Medication Instructions:   Your physician recommends that you continue on your current medications as directed. Please refer to the Current Medication list given to you today.  *If you need a refill on your cardiac medications before your next appointment, please call your pharmacy*   Lab Work:  None Ordered  If you have labs (blood work) drawn today and your tests are completely normal, you will receive your results only by: MyChart Message (if you have MyChart) OR A paper copy in the mail If you have any lab test that is abnormal or we need to change your treatment, we will call you to review the results.   Testing/Procedures:  None Ordered   Follow-Up: At [facility], you and your health needs are our priority.  As part of our continuing mission to provide you with exceptional heart care, we have created designated Provider Care Teams.  These Care Teams include your primary Cardiologist (physician) and Advanced Practice Providers (APPs -  Physician Assistants and Nurse Practitioners) who all work together to provide you with the care you need, when you need it.  We recommend signing up for the patient portal called "MyChart".  Sign up information is provided on this After Visit Summary.  MyChart is used to connect with patients for Virtual Visits (Telemedicine).  Patients are able to view lab/test results, encounter notes, upcoming appointments, etc.  Non-urgent messages can be sent to your provider as well.   To learn more about what you can do with MyChart, go to [email or web address].    Your next appointment:  As needed

## 2023-10-24 NOTE — Telephone Encounter (Signed)
 Call to inform [facility] that pt has not been seen since 2022. Also informed them that no available appts for in office appt for preop clearance and would not be enough time to hold blood thinner. LVM for the scheduler to give office a call back.

## 2023-10-24 NOTE — Progress Notes (Signed)
 Cardiology Office Note:  .   Date:  10/24/2023  ID:  Diary, Latner March 05, 1966, MRN [id number] PCP: Larue Pock, FNP  [facility] Providers Cardiologist:  Jerryl Morin, DO    History of Present Illness: .   CATTALEYA SA is a 58 y.o. female with a past medical history of nonobstructive CAD, hypertension, dyslipidemia, palpitations.  08/20/2019 left heart cath moderate single-vessel CAD, not hemodynamically significant, recommendations for medical therapy 08/16/2019 echo EF 60 to 65%, grade 2 DD 08/09/2019 coronary CTA calcium scoring 118, 96 percentile, FFR possibly hemodynamically significant 06/24/2019 monitor average heart rate 70 bpm, predominant rhythm was sinus, 6 episodes of SVT  She initially established with Dr. Gordan Latina in 2017 for the evaluation of atypical chest pain.  She was then seen again in our office in 2020 with Dr. Emmette Harms again for chest pain.  She underwent a coronary CTA revealing a calcium score of 118, FFR was possibly hemodynamically significant so she then underwent a left heart cath revealing nonobstructive CAD and isosorbide  was added to see if it would help alleviate her atypical chest pain symptoms.  She was most recently evaluated 03/08/2021, had some GI upset and was transition from aspirin  to Plavix , Repatha was continued, she was advised to follow-up in 1 year.  She presents today for preoperative evaluation for upcoming liposuction.  She has been doing well since she was last evaluated in our office, she follows closely with her PCP.  She works as a Engineer, civil (consulting), is not relatively active, does not participate in any formal exercise.  She does have intermittent episodes of chest pain however she states while working with her PCP she discovered that this was actually associated with GERD.  She has lost a considerable amount of weight, overall her health is much improved. She denies chest pain, palpitations, dyspnea, pnd, orthopnea, n, v, dizziness, syncope,  edema, weight gain, or early satiety.    ROS: Review of Systems  Musculoskeletal:  Positive for back pain.  All other systems reviewed and are negative.    Studies Reviewed: Aaron Aas   EKG Interpretation Date/Time:  Friday Oct 24 2023 14:02:04 EDT Ventricular Rate:  70 PR Interval:  122 QRS Duration:  82 QT Interval:  420 QTC Calculation: 453 R Axis:   88  Text Interpretation: Normal sinus rhythm Normal ECG When compared with ECG of 20-Aug-2019 11:29, No significant change was found Confirmed by Pattricia Bores [phone]) on 10/24/2023 2:05:34 PM    Cardiac Studies & Procedures   ______________________________________________________________________________________________ CARDIAC CATHETERIZATION  CARDIAC CATHETERIZATION 08/20/2019  Conclusion Conclusions: 1. Moderate single vessel coronary artery disease with sequential 25%, 50%, and 60% proximal and mid LAD stenoses.  The disease is not hemodynamically significant by DFR or FFR.  There is no significant coronary artery disease in the LCx or RCA. 2. Normal left ventricular filling pressure.  Recommendations: 1. Medical therapy; I will add isosorbide  mononitrate 15 mg daily, to be uptitrated as tolerated. 2. Risk factor modification to prevent progression of coronary artery disease.  Sammy Crisp, MD [facility]  Findings Coronary Findings Diagnostic  Dominance: Right  Left Main Vessel is large. Vessel is angiographically normal.  Left Anterior Descending Vessel is large. Prox LAD lesion is 25% stenosed. The lesion is eccentric. The lesion is calcified. Prox LAD to Mid LAD lesion is 50% stenosed. Pressure wire/FFR was performed on the lesion. FFR: 0.84. Mid LAD lesion is 60% stenosed. Pressure wire/FFR was performed on the lesion. FFR: 0.84.  First Product manager  Vessel is small in size.  Second Diagonal Branch Vessel is moderate in size.  Third Diagonal Branch Vessel is small in size.  Left Circumflex Vessel is  moderate in size. Vessel is angiographically normal.  First Obtuse Marginal Branch Vessel is small in size.  Second Obtuse Marginal Branch Vessel is moderate in size.  Third Obtuse Marginal Branch Vessel is small in size.  Fourth Obtuse Marginal Branch Vessel is small in size.  Right Coronary Artery Vessel is large. Vessel is angiographically normal.  Right Posterior Descending Artery Vessel is large in size.  Right Posterior Atrioventricular Artery Vessel is large in size.  Intervention  No interventions have been documented.     ECHOCARDIOGRAM  ECHOCARDIOGRAM COMPLETE 08/16/2019  Narrative ECHOCARDIOGRAM REPORT    Patient Name:   JEMMAH VANWYHE Date of Exam: 08/16/2019 Medical Rec #:  [id number]     Height:       65.0 in Accession #:    [id number]    Weight:       189.0 lb Date of Birth:  04-18-1966     BSA:          1.931 m Patient Age:    53 years      BP:           132/82 mmHg Patient Gender: F             HR:           68 bpm. Exam Location:  [facility]  Procedure: 2D Echo  Indications:    Chest pain, unspecified type [R07.9 (ICD-10-CM)]; Hypertension, unspecified type [I10 (ICD-10-CM)]  History:        Patient has no prior history of Echocardiogram examinations. CAD, Carotid Disease, Arrythmias:PAT (paroxysmal atrial tachycardia), Signs/Symptoms:Shortness of Breath and Palpitations; Risk Factors:Hypertension.  Sonographer:    Kristen Petri Referring Phys: [id number] KARDIE TOBB  IMPRESSIONS   1. Left ventricular ejection fraction, by estimation, is 60 to 65%. The left ventricle has normal function. The left ventricle has no regional wall motion abnormalities. Left ventricular diastolic parameters are consistent with Grade II diastolic dysfunction (pseudonormalization). Elevated left atrial pressure. 2. Right ventricular systolic function is normal. The right ventricular size is normal. 3. The mitral valve is normal in structure and function. No evidence of  mitral valve regurgitation. No evidence of mitral stenosis. 4. The aortic valve is normal in structure and function. Aortic valve regurgitation is not visualized. No aortic stenosis is present. 5. The inferior vena cava is normal in size with greater than 50% respiratory variability, suggesting right atrial pressure of 3 mmHg.  FINDINGS Left Ventricle: Left ventricular ejection fraction, by estimation, is 60 to 65%. The left ventricle has normal function. The left ventricle has no regional wall motion abnormalities. The left ventricular internal cavity size was normal in size. There is no left ventricular hypertrophy. Left ventricular diastolic parameters are consistent with Grade II diastolic dysfunction (pseudonormalization). Elevated left atrial pressure.  Right Ventricle: The right ventricular size is normal. No increase in right ventricular wall thickness. Right ventricular systolic function is normal.  Left Atrium: Left atrial size was normal in size.  Right Atrium: Right atrial size was normal in size.  Pericardium: There is no evidence of pericardial effusion.  Mitral Valve: The mitral valve is normal in structure and function. Normal mobility of the mitral valve leaflets. No evidence of mitral valve regurgitation. No evidence of mitral valve stenosis.  Tricuspid Valve: The tricuspid valve is normal in structure. Tricuspid valve regurgitation is  not demonstrated. No evidence of tricuspid stenosis.  Aortic Valve: The aortic valve is normal in structure and function. Aortic valve regurgitation is not visualized. No aortic stenosis is present.  Pulmonic Valve: The pulmonic valve was normal in structure. Pulmonic valve regurgitation is not visualized. No evidence of pulmonic stenosis.  Aorta: The aortic root and ascending aorta are structurally normal, with no evidence of dilitation.  Venous: A systolic blunting flow pattern is recorded from the right upper pulmonary vein. The inferior  vena cava is normal in size with greater than 50% respiratory variability, suggesting right atrial pressure of 3 mmHg.  IAS/Shunts: No atrial level shunt detected by color flow Doppler.   LEFT VENTRICLE PLAX 2D LVIDd:         4.80 cm  Diastology LVIDs:         2.90 cm  LV e' lateral:   11.10 cm/s LV PW:         1.00 cm  LV E/e' lateral: 7.6 LV IVS:        1.00 cm  LV e' medial:    9.14 cm/s LVOT diam:     1.80 cm  LV E/e' medial:  9.2 LV SV:         62.85 ml LV SV Index:   32.55 LVOT Area:     2.54 cm   RIGHT VENTRICLE            IVC RV S prime:     9.46 cm/s  IVC diam: 1.50 cm TAPSE (M-mode): 2.3 cm  LEFT ATRIUM             Index       RIGHT ATRIUM           Index LA diam:        3.50 cm 1.81 cm/m  RA Area:     11.60 cm LA Vol (A2C):   35.6 ml 18.43 ml/m RA Volume:   26.00 ml  13.46 ml/m LA Vol (A4C):   32.2 ml 16.67 ml/m LA Biplane Vol: 34.2 ml 17.71 ml/m AORTIC VALVE LVOT Vmax:   112.00 cm/s LVOT Vmean:  78.100 cm/s LVOT VTI:    0.247 m  AORTA Ao Root diam: 2.90 cm Ao Asc diam:  3.20 cm  MITRAL VALVE MV Area (PHT): 2.54 cm    SHUNTS MV Decel Time: 299 msec    Systemic VTI:  0.25 m MV E velocity: 84.40 cm/s  Systemic Diam: 1.80 cm MV A velocity: 51.90 cm/s MV E/A ratio:  1.63  Zoe Hinds MD Electronically signed by Zoe Hinds MD Signature Date/Time: 08/16/2019/4:45:02 PM    Final    MONITORS  LONG TERM MONITOR (3-14 DAYS) 07/15/2019  Narrative The patient wore the monitor for 7 days starting June 24, 2019. Indication: Palpitations  The minimum heart rate was 40 bpm, maximum heart rate was 148 bpm, and average heart rate was 70 bpm. Predominant underlying rhythm was Sinus Rhythm.  6 Supraventricular Tachycardia runs occurred, the run with the fastest interval lasting 7 beats with a maximal rate of 148 bpm, the longest lasting 7 Beats.  Premature atrial complexes were rare (less than 1%). Premature Ventricular complexes were rare (less  than 1%).  No ventricular tachycardia, no pauses, No AV block and no atrial fibrillation present. 3 patient triggered events: 1 associated with ventricular tachycardia, 2 associated with premature atrial complexes  Conclusion: This study is  symptomatic paroxysmal supraventricular tachycardia which is likely atrial tachycardia with AV block, and rare symptomatic  premature atrial complexes.   CT SCANS  CT CORONARY MORPH W/CTA COR W/SCORE 08/09/2019  Addendum 08/09/2019 10:28 AM ADDENDUM REPORT: 08/09/2019 10:25  CLINICAL DATA:  Chest pain  EXAM: Cardiac CTA  MEDICATIONS: Sub lingual nitro. 4 mg and lopressor  0mg   TECHNIQUE: The patient was scanned on a [employer] 192 scanner. Gantry rotation speed was 250 msecs. Collimation was. 6 mm . A 120 kV prospective scan was triggered in the ascending thoracic aorta at 140 HU's with full mA between 30-70% of the R-R interval . Average HR during the scan was 61 bpm. The 3D data set was interpreted on a dedicated work station using MPR, MIP and VRT modes. A total of 80 cc of contrast was used.  FINDINGS: Non-cardiac: See separate report from [facility]. No significant findings on limited lung and soft tissue windows.  Calcium score: Calcium noted in proximal RCA and LAD  Coronary Arteries: Right dominant with no anomalies  LM: Normal  LAD: 50-69% calcified plaque in proximal vessel  D1: Small vessel poorly visualized no obvious obstruction  D2: Normal  D3: Normal  Circumflex: Normal  OM1: Normal  AV Groove Normal  RCA: 1-24% calcified plaque proximally  PDA: Normal  PLA: Normal  IMPRESSION: 1. Normal diameter ascending aorta 3.3 cm  2. Calcium score 118 which is 96 th percentile for age and sex. Dense calcium in proximal LAD  3. CAD RADS 3 50-69% calcified plaque in proximal LAD Study sent for FFR CT  Janelle Mediate   Electronically Signed By: Janelle Mediate M.D. On: 08/09/2019  10:25  Narrative EXAM: OVER-READ INTERPRETATION  CT CHEST  The following report is an over-read performed by radiologist Dr. Alexandria Angel of [facility] on 08/09/2019. This over-read does not include interpretation of cardiac or coronary anatomy or pathology. The coronary calcium score/coronary CTA interpretation by the cardiologist is attached.  COMPARISON:  None.  FINDINGS: Aortic atherosclerosis. Within the visualized portions of the thorax there are no suspicious appearing pulmonary nodules or masses, there is no acute consolidative airspace disease, no pleural effusions, no pneumothorax and no lymphadenopathy. Visualized portions of the upper abdomen demonstrates an incompletely imaged low-attenuation lesion in the posterior aspect of the right lobe of the liver, which appears similar to prior CTA of the chest 01/19/2016, likely a large hepatic cyst. There are no aggressive appearing lytic or blastic lesions noted in the visualized portions of the skeleton.  IMPRESSION: 1.  Aortic Atherosclerosis (ICD10-I70.0).  Electronically Signed: By: Alexandria Angel M.D. On: 08/09/2019 08:54   CT CORONARY FRACTIONAL FLOW RESERVE DATA PREP 08/09/2019  Narrative CLINICAL DATA:  CAD  EXAM: FFR CT  TECHNIQUE: The best systolic and diastolic phases from the patients gated cardiac CTA sent to [facility] for hemodynamic analysis  FINDINGS: FFR CT normal in RCA and Circumflex  FFR CT abnormal in LAD: Mid vessel distal to stenosis 0.86 Distal vessel 0.82 and 0.77  IMPRESSION: Abnormal FFR CT see above Given location of dense calcific stenosis in proximal LAD and abnormal FFR CT would  Consider f/u diagnostic catheterization to further identify if PCI/DES warranted  Janelle Mediate   Electronically Signed By: Janelle Mediate M.D. On: 08/09/2019 14:02     ______________________________________________________________________________________________       Risk Assessment/Calculations:             Physical Exam:   VS:  BP 110/70 Comment: home BP reading  Pulse 70   Ht 5' 4.6" (1.641 m)   Wt 160 lb 12.8  oz (72.9 kg)   SpO2 98%   BMI 27.09 kg/m    Wt Readings from Last 3 Encounters:  10/24/23 160 lb 12.8 oz (72.9 kg)  06/14/23 162 lb 6.4 oz (73.7 kg)  03/08/21 195 lb 6.4 oz (88.6 kg)    GEN: Well nourished, well developed in no acute distress NECK: No JVD; No carotid bruits CARDIAC: RRR, no murmurs, rubs, gallops RESPIRATORY:  Clear to auscultation without rales, wheezing or rhonchi  ABDOMEN: Soft, non-tender, non-distended EXTREMITIES:  No edema; No deformity   ASSESSMENT AND PLAN: .   CAD-nonobstructive per left heart cath in 2021. Stable with no anginal symptoms. No indication for ischemic evaluation.  She is currently on Plavix  75 mg daily as she was unable to tolerate aspirin . Heart healthy diet and regular cardiovascular exercise encouraged.  Currently taking Ranexa  500 mg once a day--per her preference, continue metoprolol  12.5 mg daily, continue Plavix  75 mg daily, continue Nexlizet at 180-10 mg daily.  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  Dyslipidemia-is monitored by her PCP, currently on Nexis at 180-10 mg daily, prefer LDL to be less than 70.  Preoperative cardiovascular evaluation- According to the Revised Cardiac Risk Index (RCRI), her Perioperative Risk of Major Cardiac Event is (%): 0.4 Her Functional Capacity in METs is: 5.07 according to the Duke Activity Status Index (DASI).  She is optimized from a cardiac perspective and does not need to undergo any further testing prior to her upcoming procedure.  Regarding her Plavix , this can be held 5 to 7 days prior to her procedure and resumed at the discretion of the surgeon.        Dispo: Follow-up on a as needed basis.  Signed, Terrance Ferretti, NP
